# Patient Record
Sex: Male | Born: 1960 | Race: White | Hispanic: No | Marital: Married | Smoking: Never smoker
Health system: Southern US, Community
[De-identification: ages and names within clinical notes are randomized; demographics above are authoritative.]

## PROBLEM LIST (undated history)

## (undated) DIAGNOSIS — I10 Essential (primary) hypertension: Secondary | ICD-10-CM

## (undated) DIAGNOSIS — E039 Hypothyroidism, unspecified: Secondary | ICD-10-CM

## (undated) DIAGNOSIS — E785 Hyperlipidemia, unspecified: Secondary | ICD-10-CM

## (undated) DIAGNOSIS — M109 Gout, unspecified: Secondary | ICD-10-CM

## (undated) HISTORY — PX: VASECTOMY: SHX75

## (undated) HISTORY — DX: Hyperlipidemia, unspecified: E78.5

## (undated) HISTORY — PX: TENDON REPAIR: SHX5111

## (undated) HISTORY — DX: Gout, unspecified: M10.9

## (undated) HISTORY — DX: Essential (primary) hypertension: I10

## (undated) HISTORY — DX: Hypothyroidism, unspecified: E03.9

---

## 2011-06-10 ENCOUNTER — Emergency Department (HOSPITAL_COMMUNITY)
Admission: EM | Admit: 2011-06-10 | Discharge: 2011-06-10 | Disposition: A | Discharge status: Home / Self Care | Payer: Worker's Compensation | Attending: Emergency Medicine | Admitting: Emergency Medicine

## 2011-06-10 ENCOUNTER — Emergency Department (HOSPITAL_COMMUNITY): Payer: Worker's Compensation

## 2011-06-10 DIAGNOSIS — Y990 Civilian activity done for income or pay: Secondary | ICD-10-CM | POA: Insufficient documentation

## 2011-06-10 DIAGNOSIS — S61209A Unspecified open wound of unspecified finger without damage to nail, initial encounter: Secondary | ICD-10-CM | POA: Insufficient documentation

## 2011-06-10 DIAGNOSIS — E039 Hypothyroidism, unspecified: Secondary | ICD-10-CM | POA: Insufficient documentation

## 2011-06-10 DIAGNOSIS — W268XXA Contact with other sharp object(s), not elsewhere classified, initial encounter: Secondary | ICD-10-CM | POA: Insufficient documentation

## 2011-06-10 DIAGNOSIS — Z79899 Other long term (current) drug therapy: Secondary | ICD-10-CM | POA: Insufficient documentation

## 2011-06-10 LAB — CBC
HCT: 40.3 % (ref 39.0–52.0)
Hemoglobin: 14.6 g/dL (ref 13.0–17.0)
RBC: 4.42 MIL/uL (ref 4.22–5.81)
RDW: 11.6 % (ref 11.5–15.5)
WBC: 8.3 10*3/uL (ref 4.0–10.5)

## 2011-06-10 LAB — POCT I-STAT, CHEM 8
Chloride: 103 mEq/L (ref 96–112)
HCT: 44 % (ref 39.0–52.0)
Hemoglobin: 15 g/dL (ref 13.0–17.0)
Potassium: 4.2 mEq/L (ref 3.5–5.1)
Sodium: 138 mEq/L (ref 135–145)

## 2011-06-10 LAB — DIFFERENTIAL
Basophils Absolute: 0.1 10*3/uL (ref 0.0–0.1)
Lymphocytes Relative: 12 % (ref 12–46)
Neutro Abs: 6.3 10*3/uL (ref 1.7–7.7)

## 2011-06-10 LAB — PROTIME-INR: INR: 0.92 (ref 0.00–1.49)

## 2011-07-16 NOTE — Consult Note (Signed)
  NAMEJAMEL, HOLZMANN NO.:  [id number]  MEDICAL RECORD NO.:  [id number]  LOCATION:  [facility]                         FACILITY:  [facility]  PHYSICIAN:  Artist Pais. Krishav Mamone, M.D.DATE OF BIRTH:  Dec 07, 1961  DATE OF CONSULTATION:  06/10/2011 DATE OF DISCHARGE:                                CONSULTATION   REQUESTING PHYSICIAN:  Doug Sou, MD  REASON FOR CONSULTATION:  This is a 50 year old right-hand-dominant who was working today, changing a tire and lacerated the volar aspect of his right long finger with loss of active flexion from the DIP joint distal. Neurosensory exam was normal.  He is 49.  He has no known drug allergies.  No current medications except Synthroid and some med that he takes for hypercholesteremia.  No recent hospitalizations or surgery.  FAMILY MEDICAL HISTORY:  Noncontributory.  SOCIAL HISTORY:  Occasional alcohol use.  He does not smoke.  PHYSICAL EXAMINATION:  This is a well-nourished male, pleasant, alert and oriented x3.  Examination of his upper extremity, on the right, he has a transverse laceration of the PIP flexion crease with loss of active flexion distal.  Neurosensory exam is normal.  X-rays are negative and blood work and EKG are pending.  IMPRESSION:  This is a 50 year old male with a deep laceration on the volar aspect of his right long finger with probable tendon damage.  He will be taken to the operating room for an I and D, exploration repair as necessary of right long finger.     Artist Pais Mina Marble, M.D.     MAW/MEDQ  D:  06/10/2011  T:  06/11/2011  Job:  [id number]  Electronically Signed by Dairl Ponder M.D. on 07/16/2011 08:34:53 PM

## 2011-07-16 NOTE — Op Note (Signed)
  NAMEAC, COLAN NO.:  [id number]  MEDICAL RECORD NO.:  [id number]  LOCATION:  [facility]                         FACILITY:  [facility]  PHYSICIAN:  Artist Pais. Hershal Eriksson, M.D.DATE OF BIRTH:  [birth year]  DATE OF PROCEDURE:  06/10/2011 DATE OF DISCHARGE:                              OPERATIVE REPORT   PREOPERATIVE DIAGNOSIS:  Deep laceration, volar aspect, right long finger.  POSTOPERATIVE DIAGNOSIS:  Deep laceration, volar aspect, right long finger.  PROCEDURE:  Incision and drainage above with primary repair zone II flexor digitorum profundus and flexor digitorum superficialis tendons, right long finger.  SURGEON:  Artist Pais. Mina Marble, MD  ASSISTANT:  None.  ANESTHESIA:  General.  TOURNIQUET TIME:  60 minutes.  No complication.  No drains.  The patient was taken to the operating suite after induction of general anesthesia.  Right upper extremity was prepped and draped in sterile fashion.  An Esmarch was used to exsanguinate the limb.  Tourniquet was inflated to 250 mm.  At this point in time, a transverse laceration of the PIP flexion crease extended proximally and distally in mid lateral fashion with the radial limb proximal and the ulnar limb distal.  Once this was done, flaps were raised.  Neurovascularly, bone was identified and retracted.  There was a complete laceration profundus tendon zone II and partial lacerations of the superficialis slip at their insertion point just under the A4 pulley area.  The profundus tendon was retracted distal to the A4 pulley and proximal to the A2 pulley.  The superficialis slips were sutured with horizontal mattress sutures x1 at each insertion point using 4-0 Mersilene.  Next a 3-0 double-armed Ethibond was used to suture the retrieved proximal portion and also the distal portion.  The distal portion was then passed under the A4 pulley. The proximal portion of the profundus tendon was passed under the A2 pulley  and onto the wound.  They were sutured under no undue tension using a Tajima suture, followed by 6-0 Prolene epitenon suture just proximal to the edge of the A4 pulley.  The wound was then thoroughly irrigated.  Hemostasis was achieved with bipolar cautery and loosely closed with 4-0 Vicryl Rapide suture.  Xeroform, 4x4s and dorsal extension block splint was applied.  The patient tolerated the well and went to recovery room in stable fashion.     Artist Pais Mina Marble, M.D.     MAW/MEDQ  D:  06/10/2011  T:  06/11/2011  Job:  [id number]  Electronically Signed by Dairl Ponder M.D. on 07/16/2011 08:34:49 PM

## 2012-06-19 DIAGNOSIS — E039 Hypothyroidism, unspecified: Secondary | ICD-10-CM | POA: Insufficient documentation

## 2013-04-15 ENCOUNTER — Ambulatory Visit (HOSPITAL_BASED_OUTPATIENT_CLINIC_OR_DEPARTMENT_OTHER)
Admission: RE | Admit: 2013-04-15 | Discharge: 2013-04-15 | Disposition: A | Discharge status: Home / Self Care | Payer: 59 | Source: Ambulatory Visit | Attending: Family Medicine | Admitting: Family Medicine

## 2013-04-15 ENCOUNTER — Encounter: Payer: Self-pay | Admitting: Family Medicine

## 2013-04-15 ENCOUNTER — Ambulatory Visit (INDEPENDENT_AMBULATORY_CARE_PROVIDER_SITE_OTHER): Payer: 59 | Admitting: Family Medicine

## 2013-04-15 VITALS — BP 138/88 | HR 106 | Ht 70.0 in | Wt 188.0 lb

## 2013-04-15 DIAGNOSIS — M25569 Pain in unspecified knee: Secondary | ICD-10-CM

## 2013-04-15 DIAGNOSIS — M25562 Pain in left knee: Secondary | ICD-10-CM

## 2013-04-15 MED ORDER — IBUPROFEN 800 MG PO TABS: 800.0000 mg | ORAL_TABLET | Freq: Three times a day (TID) | ORAL | Status: DC | PRN

## 2013-04-15 NOTE — Patient Instructions (Addendum)
Your x-rays were negative for a fracture. Your exam and history are consistent with a gout flare. Less likely consideration would be a meniscal tear but your specific testing for this is negative. Cortisone shot was given today - may take a few days for this to kick in. Ibuprofen 800mg  three times a day with food for pain and inflammation. Ice knee 15 minutes at a time 3-4 times a day. ACE wrap for compression. Elevate as much as possible. Use crutches as needed. Consider colchicine, prednisone if not improving as expected over the next week. Follow up with me in 1-2 weeks or as needed.

## 2013-04-16 ENCOUNTER — Encounter: Payer: Self-pay | Admitting: Family Medicine

## 2013-04-16 DIAGNOSIS — M25562 Pain in left knee: Secondary | ICD-10-CM | POA: Insufficient documentation

## 2013-04-16 NOTE — Assessment & Plan Note (Signed)
started with injury sustained at work. Radiographs negative for tibial plateau or other fracture.  Meniscal and ligamentous testing negative.  Consistent with either an acute gout flare or subtle meniscal tear.  Start with conservative care.  Attempted aspiration but only 1 mL straw colored (blood-tinged) joint fluid obtained prior to injection.  Ibuprofen, icing, elevation, compression.  F/u in 1-2 weeks for reevaluation (or as needed if completely improved.  After informed written consent patient was lying supine on exam table.  Right knee was prepped with alcohol swab.  Utilizing superolateral approach, 3 mL of marcaine was used for local anesthesia.  Then using an 18g needle on 60cc syringe, 1 mL of clear straw-colored fluid was aspirated from right knee (effusion confirmed with ultrasound).  Knee was then injected with 3:1 marcaine:depomedrol.  Patient tolerated procedure well without immediate complications

## 2013-04-16 NOTE — Progress Notes (Signed)
  Subjective:    Patient ID: George Hawkins, male    DOB: 1961/07/28, 52 y.o.   MRN: [id number]  PCP: Dr. Dossie Arbour  HPI 52 yo M here for left knee injury/pain.  Patient states on 4/19 he was loading a truck with brush when he was tangled in it, fell directly onto his left knee. Thinks he twisted knee as well - became progressively swollen, more painful. Taking ibuprofen. Has been using crutches and elevating as well. No bruising. Has prior history of gout. Knee more warm than usual. No catching, locking.  Past Medical History  Diagnosis Date  . Hypertension   . Hyperlipidemia   . Hypothyroidism   . Gout     No current outpatient prescriptions on file prior to visit.   No current facility-administered medications on file prior to visit.    History reviewed. No pertinent past surgical history.  No Known Allergies  History   Social History  . Marital Status: Married    Spouse Name: N/A    Number of Children: N/A  . Years of Education: N/A   Occupational History  . Not on file.   Social History Main Topics  . Smoking status: Never Smoker   . Smokeless tobacco: Not on file  . Alcohol Use: Not on file  . Drug Use: Not on file  . Sexually Active: Not on file   Other Topics Concern  . Not on file   Social History Narrative  . No narrative on file    Family History  Problem Relation Age of Onset  . Diabetes Brother   . Heart attack Neg Hx   . Hyperlipidemia Neg Hx   . Hypertension Neg Hx   . Sudden death Neg Hx     BP 138/88  Pulse 106  Ht 5\' 10"  (1.778 m)  Wt 188 lb (85.276 kg)  BMI 26.98 kg/m2  Review of Systems See HPI above.    Objective:   Physical Exam Gen: NAD  L knee: Mild effusion and warmth. No erythema, ecchymoses, other deformity. Mild TTP diffusely. ROM 0 - 90 degrees, slow. Negative ant/post drawers. Negative valgus/varus testing. Negative lachmanns. Negative mcmurrays, apleys, patellar apprehension, clarkes. NV intact  distally.    Assessment & Plan:  1. Left knee pain, swelling - started with injury sustained at work. Radiographs negative for tibial plateau or other fracture.  Meniscal and ligamentous testing negative.  Consistent with either an acute gout flare or subtle meniscal tear.  Start with conservative care.  Attempted aspiration but only 1 mL straw colored (blood-tinged) joint fluid obtained prior to injection.  Ibuprofen, icing, elevation, compression.  F/u in 1-2 weeks for reevaluation (or as needed if completely improved.  After informed written consent patient was lying supine on exam table.  Right knee was prepped with alcohol swab.  Utilizing superolateral approach, 3 mL of marcaine was used for local anesthesia.  Then using an 18g needle on 60cc syringe, 1 mL of clear straw-colored fluid was aspirated from right knee (effusion confirmed with ultrasound).  Knee was then injected with 3:1 marcaine:depomedrol.  Patient tolerated procedure well without immediate complications

## 2013-12-20 ENCOUNTER — Encounter: Payer: Self-pay | Admitting: Podiatry

## 2013-12-20 ENCOUNTER — Ambulatory Visit (INDEPENDENT_AMBULATORY_CARE_PROVIDER_SITE_OTHER): Payer: 59

## 2013-12-20 ENCOUNTER — Ambulatory Visit (INDEPENDENT_AMBULATORY_CARE_PROVIDER_SITE_OTHER): Payer: 59 | Admitting: Podiatry

## 2013-12-20 VITALS — BP 155/88 | HR 80 | Resp 16

## 2013-12-20 DIAGNOSIS — R609 Edema, unspecified: Secondary | ICD-10-CM

## 2013-12-20 DIAGNOSIS — M109 Gout, unspecified: Secondary | ICD-10-CM

## 2013-12-20 DIAGNOSIS — M7750 Other enthesopathy of unspecified foot: Secondary | ICD-10-CM

## 2013-12-20 MED ORDER — COLCHICINE 0.6 MG PO TABS: ORAL_TABLET | ORAL | Status: DC

## 2013-12-20 MED ORDER — METHYLPREDNISOLONE (PAK) 4 MG PO TABS: ORAL_TABLET | ORAL | Status: DC

## 2013-12-20 MED ORDER — TRIAMCINOLONE ACETONIDE 10 MG/ML IJ SUSP
10.0000 mg | Freq: Once | INTRAMUSCULAR | Status: AC
  Administered 2013-12-20: 10 mg

## 2013-12-20 NOTE — Patient Instructions (Signed)

## 2013-12-20 NOTE — Progress Notes (Signed)
Subjective:     Patient ID: George Hawkins, male   DOB: 1961-04-04, 52 y.o.   MRN: [id number]  HPI patient presents stating my foot started to hurt me 3 days ago and I feel like the gout is back again. Stating he rich food over the Christmas holiday which probably lead towards this and he has been taking his allopurinol. Points to the heel top and outside of the foot with +2 swelling noted  Review of Systems     Objective:   Physical Exam Neurovascular status intact with negative Homans sign noted exquisite discomfort right plantar heel and moderate discomfort dorsum of the right foot and towards the lateral side of the foot.    Assessment:     Acute gout attack affecting the heel with edema which is most likely related to this inflammatory complex    Plan:     X-ray and gout condition reviewed. We are going to start him on a steroid pack and I also prescribed colchrys to use if attacks occur and today I injected the right plantar heel 3 mg Kenalog 5 mg Xylocaine Marcaine mixture and applied Unna boot to reduce swelling. If symptoms do not get better in the next several days I want to see back

## 2014-01-14 ENCOUNTER — Other Ambulatory Visit: Payer: Self-pay | Admitting: Podiatry

## 2014-02-07 ENCOUNTER — Ambulatory Visit: Payer: Self-pay | Admitting: Gastroenterology

## 2014-02-07 LAB — HM COLONOSCOPY

## 2014-02-09 LAB — PATHOLOGY REPORT

## 2014-05-02 ENCOUNTER — Other Ambulatory Visit: Payer: Self-pay | Admitting: Podiatry

## 2014-05-03 ENCOUNTER — Encounter: Payer: Self-pay | Admitting: Podiatry

## 2014-05-03 ENCOUNTER — Ambulatory Visit (INDEPENDENT_AMBULATORY_CARE_PROVIDER_SITE_OTHER): Payer: 59 | Admitting: Podiatry

## 2014-05-03 DIAGNOSIS — M7750 Other enthesopathy of unspecified foot: Secondary | ICD-10-CM

## 2014-05-03 MED ORDER — ALLOPURINOL 300 MG PO TABS: 300.0000 mg | ORAL_TABLET | Freq: Every day | ORAL | Status: DC

## 2014-05-04 NOTE — Progress Notes (Signed)
Subjective:     Patient ID: George Hawkins, male   DOB: Sep 06, 1961, 53 y.o.   MRN: [id number]  HPI presents for refill stating his gout has been doing well and his tendinitis   Review of Systems     Objective:   Physical Exam No change in neurovascular or health status    Assessment:     Doing well    Plan:     Prescription written again today for allopurinol 300 mg daily

## 2014-11-10 IMAGING — CR DG KNEE COMPLETE 4+V*L*
4 series · 4 of 4 positions shown · non-contrast
Comparison: None.

CLINICAL DATA: Pain post trauma

LEFT KNEE - COMPLETE 4+ VIEW

[t knee ap left]
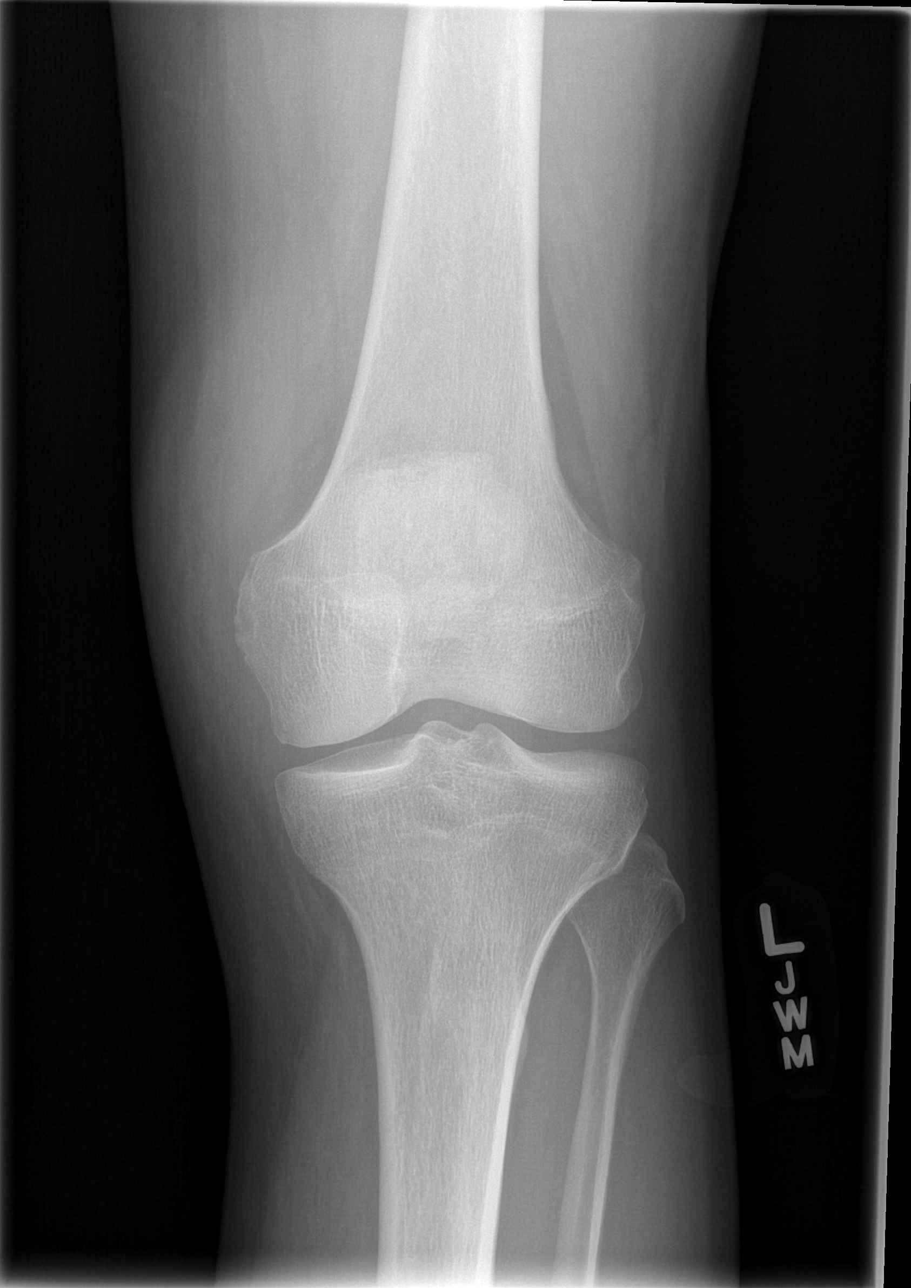

[t knee oblique left (1 of 2)]
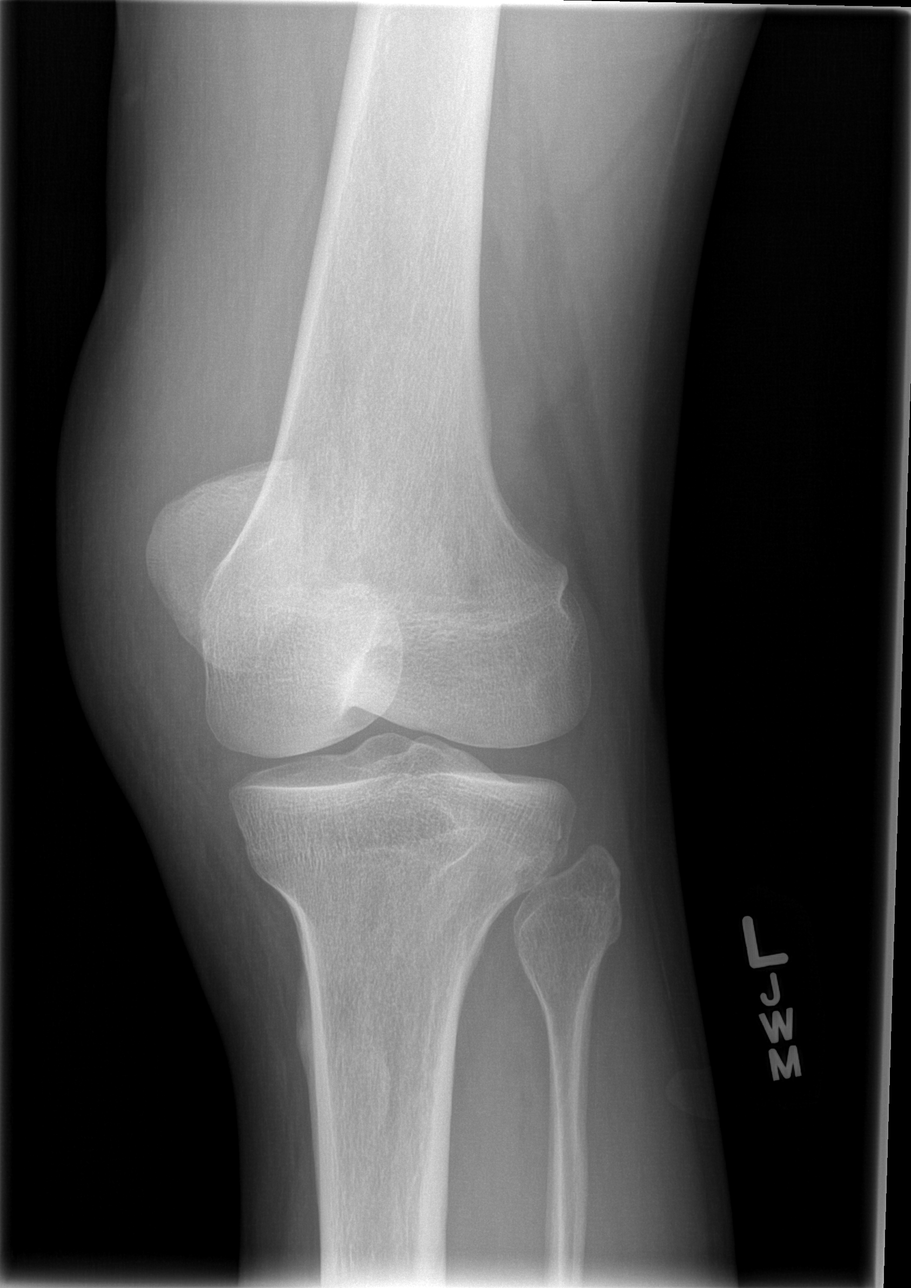

[t knee oblique left (2 of 2)]
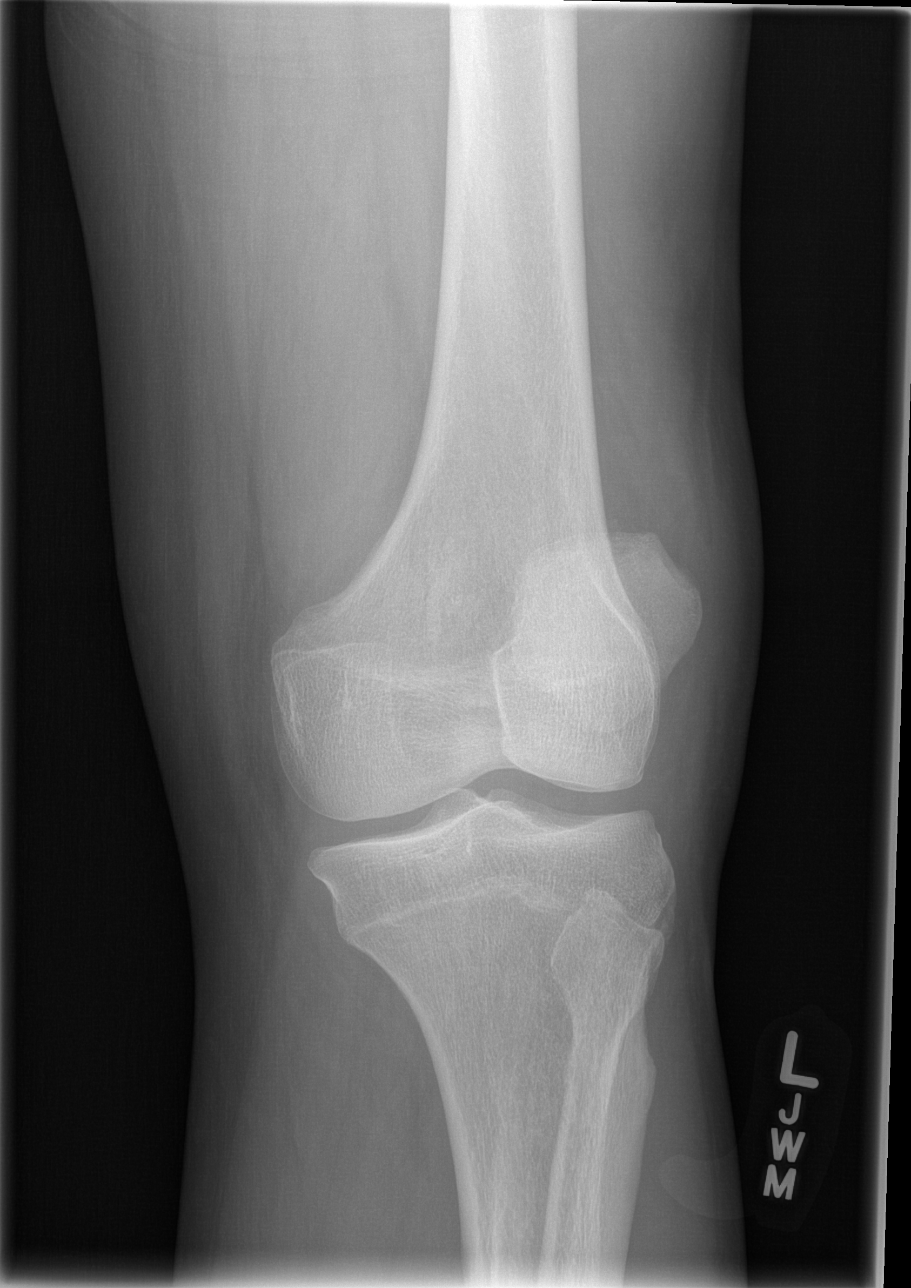

[t knee lat left]
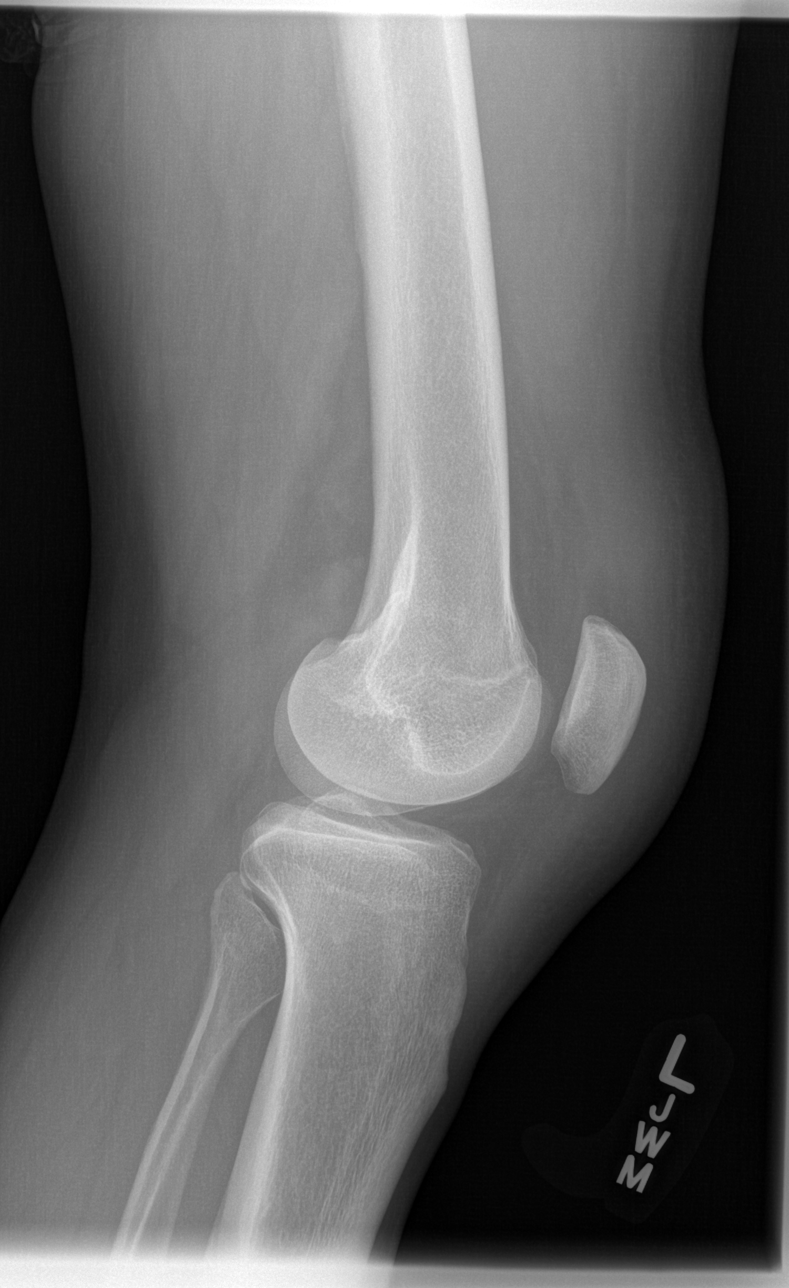

[4 of 4 positions shown; findings below may reference images not displayed]

FINDINGS: Frontal, lateral, bilateral oblique views were obtained.
There is prepatellar soft tissue swelling.  There is no apparent
fracture, dislocation, or effusion.  No erosive change.  Joint
spaces appear intact.
IMPRESSION: No fracture or effusion.  There is prepatellar soft tissue
swelling.  Joint spaces appear intact.

## 2014-12-20 ENCOUNTER — Telehealth: Payer: Self-pay | Admitting: *Deleted

## 2014-12-20 NOTE — Telephone Encounter (Signed)
Pt request refill of his Allopurinol.

## 2014-12-20 NOTE — Telephone Encounter (Signed)
Spoke to [facility] , per Dr Paulla Dolly needs to follow up with primary doctor concerning allopurinol medication

## 2015-10-30 ENCOUNTER — Encounter: Payer: Self-pay | Admitting: Unknown Physician Specialty

## 2015-10-30 ENCOUNTER — Ambulatory Visit (INDEPENDENT_AMBULATORY_CARE_PROVIDER_SITE_OTHER): Payer: 59 | Admitting: Unknown Physician Specialty

## 2015-10-30 VITALS — BP 129/81 | HR 85 | Temp 97.6°F | Ht 70.8 in | Wt 205.6 lb

## 2015-10-30 DIAGNOSIS — E785 Hyperlipidemia, unspecified: Secondary | ICD-10-CM | POA: Diagnosis not present

## 2015-10-30 DIAGNOSIS — E039 Hypothyroidism, unspecified: Secondary | ICD-10-CM | POA: Diagnosis not present

## 2015-10-30 DIAGNOSIS — M1A9XX0 Chronic gout, unspecified, without tophus (tophi): Secondary | ICD-10-CM | POA: Diagnosis not present

## 2015-10-30 DIAGNOSIS — Z0000 Encounter for general adult medical examination without abnormal findings: Secondary | ICD-10-CM

## 2015-10-30 DIAGNOSIS — I10 Essential (primary) hypertension: Secondary | ICD-10-CM | POA: Diagnosis not present

## 2015-10-30 LAB — MICROALBUMIN, URINE WAIVED
Creatinine, Urine Waived: 50 mg/dL (ref 10–300)
Microalb, Ur Waived: 10 mg/L (ref 0–19)
Microalb/Creat Ratio: <30 mg/g (ref <30)

## 2015-10-30 MED ORDER — LEVOTHYROXINE SODIUM 150 MCG PO TABS
150.0000 ug | ORAL_TABLET | Freq: Every day | ORAL | Status: DC
Start: 2015-10-30 — End: 2016-12-09

## 2015-10-30 MED ORDER — ATORVASTATIN CALCIUM 10 MG PO TABS: 10.0000 mg | ORAL_TABLET | Freq: Every day | ORAL | Status: DC

## 2015-10-30 MED ORDER — ATENOLOL 100 MG PO TABS: 100.0000 mg | ORAL_TABLET | Freq: Every day | ORAL | Status: DC

## 2015-10-30 NOTE — Progress Notes (Signed)
+++++++    +  BP 129/81 mmHg  Pulse 85  Temp(Src) 97.6 F (36.4 C)  Ht 5' 10.8" (1.798 m)  Wt 205 lb 9.6 oz (93.26 kg)  BMI 28.85 kg/m2  SpO2 96%   Subjective:    Patient ID: George Hawkins, male    DOB: [birth year], 54 y.o.   MRN: [id number]  HPI: George Hawkins is a 54 y.o. male  Chief Complaint  Patient presents with  . Annual Exam   Hypertension Using medications without difficulty Average home BPs   No problems or lightheadedness No chest pain with exertion or shortness of breath No Edema   Hyperlipidemia Using medications without problems No Muscle aches Diet compliance:good Exercise: Started "some" exercise    Relevant past medical, surgical, family and social history reviewed and updated as indicated. Interim medical history since our last visit reviewed. Allergies and medications reviewed and updated.  Review of Systems  Constitutional: Negative.   HENT: Negative.   Eyes: Negative.   Respiratory: Negative.   Cardiovascular: Negative.   Gastrointestinal: Negative.   Endocrine: Negative.   Genitourinary: Negative.   Skin: Negative.   Allergic/Immunologic: Negative.   Neurological: Negative.   Hematological: Negative.   Psychiatric/Behavioral: Negative.     Per HPI unless specifically indicated above     Objective:    BP 129/81 mmHg  Pulse 85  Temp(Src) 97.6 F (36.4 C)  Ht 5' 10.8" (1.798 m)  Wt 205 lb 9.6 oz (93.26 kg)  BMI 28.85 kg/m2  SpO2 96%  Wt Readings from Last 3 Encounters:  10/30/15 205 lb 9.6 oz (93.26 kg)  04/18/15 208 lb (94.348 kg)  04/15/13 188 lb (85.276 kg)    Physical Exam  Constitutional: He is oriented to person, place, and time. He appears well-developed and well-nourished.  HENT:  Head: Normocephalic.  Eyes: Pupils are equal, round, and reactive to light.  Cardiovascular: Normal rate, regular rhythm and normal heart sounds.   Pulmonary/Chest: Effort normal.  Abdominal: Soft. Bowel sounds are normal.   Musculoskeletal: Normal range of motion.  Neurological: He is alert and oriented to person, place, and time. He has normal reflexes.  Skin: Skin is warm and dry.  Psychiatric: He has a normal mood and affect. His behavior is normal. Judgment and thought content normal.    Results for orders placed or performed in visit on 10/27/15  HM COLONOSCOPY  Result Value Ref Range   HM Colonoscopy PP       Assessment & Plan:   Problem List Items Addressed This Visit      Unprioritized   Adult hypothyroidism   Relevant Medications   atenolol (TENORMIN) 100 MG tablet   levothyroxine (SYNTHROID, LEVOTHROID) 150 MCG tablet   Hypertension - Primary   Relevant Medications   atenolol (TENORMIN) 100 MG tablet   atorvastatin (LIPITOR) 10 MG tablet   Other Relevant Orders   Comprehensive metabolic panel   Microalbumin, Urine Waived   Uric acid   Hyperlipidemia   Relevant Medications   atenolol (TENORMIN) 100 MG tablet   atorvastatin (LIPITOR) 10 MG tablet   Other Relevant Orders   Comprehensive metabolic panel   Lipid Panel w/o Chol/HDL Ratio   Chronic gout   Relevant Orders   Uric acid    Other Visit Diagnoses    Routine general medical examination at a health care facility        Relevant Orders    CBC with Differential/Platelet    Hepatitis C antibody  HIV antibody    TSH        Follow up plan: Return in about 6 months (around 04/28/2016).

## 2015-10-31 ENCOUNTER — Encounter: Payer: Self-pay | Admitting: Unknown Physician Specialty

## 2015-10-31 LAB — URIC ACID: Uric Acid: 9.3 mg/dL — ABNORMAL HIGH (ref 3.7–8.6)

## 2015-10-31 LAB — LIPID PANEL W/O CHOL/HDL RATIO
Cholesterol, Total: 172 mg/dL (ref 100–199)
HDL: 41 mg/dL (ref >39)
LDL Calculated: 58 mg/dL (ref 0–99)
TRIGLYCERIDES: 366 mg/dL — AB (ref 0–149)
VLDL CHOLESTEROL CAL: 73 mg/dL — AB (ref 5–40)

## 2015-10-31 LAB — CBC WITH DIFFERENTIAL/PLATELET
BASOS: 1 %
Basophils Absolute: 0.1 10*3/uL (ref 0.0–0.2)
EOS (ABSOLUTE): 0.3 10*3/uL (ref 0.0–0.4)
EOS: 5 %
HEMATOCRIT: 40.6 % (ref 37.5–51.0)
Hemoglobin: 14.2 g/dL (ref 12.6–17.7)
IMMATURE GRANS (ABS): 0 10*3/uL (ref 0.0–0.1)
IMMATURE GRANULOCYTES: 1 %
LYMPHS: 20 %
Lymphocytes Absolute: 1 10*3/uL (ref 0.7–3.1)
MCH: 33.4 pg — ABNORMAL HIGH (ref 26.6–33.0)
MCHC: 35 g/dL (ref 31.5–35.7)
MCV: 96 fL (ref 79–97)
MONOCYTES: 11 %
Monocytes Absolute: 0.5 10*3/uL (ref 0.1–0.9)
NEUTROS PCT: 62 %
Neutrophils Absolute: 3.1 10*3/uL (ref 1.4–7.0)
Platelets: 178 10*3/uL (ref 150–379)
RBC: 4.25 x10E6/uL (ref 4.14–5.80)
RDW: 12.9 % (ref 12.3–15.4)
WBC: 4.9 10*3/uL (ref 3.4–10.8)

## 2015-10-31 LAB — COMPREHENSIVE METABOLIC PANEL
A/G RATIO: 1.9 (ref 1.1–2.5)
ALT: 34 IU/L (ref 0–44)
AST: 22 IU/L (ref 0–40)
Albumin: 4.5 g/dL (ref 3.5–5.5)
Alkaline Phosphatase: 80 IU/L (ref 39–117)
BUN/Creatinine Ratio: 19 (ref 9–20)
BUN: 17 mg/dL (ref 6–24)
Bilirubin Total: 0.4 mg/dL (ref 0.0–1.2)
CALCIUM: 9.5 mg/dL (ref 8.7–10.2)
CO2: 22 mmol/L (ref 18–29)
CREATININE: 0.89 mg/dL (ref 0.76–1.27)
Chloride: 100 mmol/L (ref 97–106)
GFR, EST AFRICAN AMERICAN: 114 mL/min/{1.73_m2} (ref >59)
GFR, EST NON AFRICAN AMERICAN: 98 mL/min/{1.73_m2} (ref >59)
GLOBULIN, TOTAL: 2.4 g/dL (ref 1.5–4.5)
Glucose: 104 mg/dL — ABNORMAL HIGH (ref 65–99)
POTASSIUM: 5.2 mmol/L (ref 3.5–5.2)
Sodium: 138 mmol/L (ref 136–144)
TOTAL PROTEIN: 6.9 g/dL (ref 6.0–8.5)

## 2015-10-31 LAB — HIV ANTIBODY (ROUTINE TESTING W REFLEX): HIV SCREEN 4TH GENERATION: NONREACTIVE

## 2015-10-31 LAB — TSH: TSH: 0.611 u[IU]/mL (ref 0.450–4.500)

## 2015-10-31 LAB — HEPATITIS C ANTIBODY: Hep C Virus Ab: <0.1 s/co ratio (ref 0.0–0.9)

## 2015-12-28 MED FILL — ATORVASTATIN 10 MG TABLET: 10 | 90 days supply | Qty: 90 | Fill #0

## 2016-02-13 MED FILL — ATENOLOL 100 MG TABLET: 100 | 90 days supply | Qty: 90 | Fill #1

## 2016-02-26 MED FILL — LEVOTHYROXINE 150 MCG TAB: 150 | 90 days supply | Qty: 90 | Fill #1

## 2016-03-29 MED FILL — ATORVASTATIN 10 MG TABLET: 10 | 90 days supply | Qty: 90 | Fill #1

## 2016-04-29 ENCOUNTER — Encounter: Payer: Self-pay | Admitting: Unknown Physician Specialty

## 2016-04-29 ENCOUNTER — Ambulatory Visit (INDEPENDENT_AMBULATORY_CARE_PROVIDER_SITE_OTHER): Payer: 59 | Admitting: Unknown Physician Specialty

## 2016-04-29 VITALS — BP 138/84 | HR 64 | Temp 97.6°F | Ht 70.6 in | Wt 200.2 lb

## 2016-04-29 DIAGNOSIS — I10 Essential (primary) hypertension: Secondary | ICD-10-CM | POA: Diagnosis not present

## 2016-04-29 DIAGNOSIS — E785 Hyperlipidemia, unspecified: Secondary | ICD-10-CM | POA: Diagnosis not present

## 2016-04-29 DIAGNOSIS — R7301 Impaired fasting glucose: Secondary | ICD-10-CM

## 2016-04-29 LAB — MICROALBUMIN, URINE WAIVED
CREATININE, URINE WAIVED: 200 mg/dL (ref 10–300)
Microalb, Ur Waived: 10 mg/L (ref 0–19)

## 2016-04-29 LAB — BAYER DCA HB A1C WAIVED: HB A1C (BAYER DCA - WAIVED): 5.4 % (ref <7.0)

## 2016-04-29 MED ORDER — ATENOLOL 100 MG PO TABS: 100.0000 mg | ORAL_TABLET | Freq: Every day | ORAL | Status: DC

## 2016-04-29 MED ORDER — ATORVASTATIN CALCIUM 10 MG PO TABS: 10.0000 mg | ORAL_TABLET | Freq: Every day | ORAL | Status: DC

## 2016-04-29 NOTE — Progress Notes (Signed)
   BP 138/84 mmHg  Pulse 64  Temp(Src) 97.6 F (36.4 C)  Ht 5' 10.6" (1.793 m)  Wt 200 lb 3.2 oz (90.81 kg)  BMI 28.25 kg/m2  SpO2 98%   Subjective:    Patient ID: George Hawkins, male    DOB: 1961/01/17, 55 y.o.   MRN: [id number]  HPI: [name] is a 55 y.o. male  Chief Complaint  Patient presents with  . Hyperlipidemia  . Hypertension  . Hypothyroidism   Hypertension Using medications without difficulty Average home BPs Not checking  No problems or lightheadedness No chest pain with exertion or shortness of breath No Edema   Hyperlipidemia Using medications without problems: No Muscle aches  Diet compliance: Exercise:  Hypothyroid   Relevant past medical, surgical, family and social history reviewed and updated as indicated. Interim medical history since our last visit reviewed. Allergies and medications reviewed and updated.  Review of Systems  Per HPI unless specifically indicated above     Objective:    BP 138/84 mmHg  Pulse 64  Temp(Src) 97.6 F (36.4 C)  Ht 5' 10.6" (1.793 m)  Wt 200 lb 3.2 oz (90.81 kg)  BMI 28.25 kg/m2  SpO2 98%  Wt Readings from Last 3 Encounters:  04/29/16 200 lb 3.2 oz (90.81 kg)  10/30/15 205 lb 9.6 oz (93.26 kg)  04/18/15 208 lb (94.348 kg)    Physical Exam  Constitutional: He is oriented to person, place, and time. He appears well-developed and well-nourished. No distress.  HENT:  Head: Normocephalic and atraumatic.  Eyes: Conjunctivae and lids are normal. Right eye exhibits no discharge. Left eye exhibits no discharge. No scleral icterus.  Neck: Normal range of motion. Neck supple. No JVD present. Carotid bruit is not present.  Cardiovascular: Normal rate, regular rhythm and normal heart sounds.   Pulmonary/Chest: Effort normal and breath sounds normal. No respiratory distress.  Abdominal: Normal appearance. There is no splenomegaly or hepatomegaly.  Musculoskeletal: Normal range of motion.   Neurological: He is alert and oriented to person, place, and time.  Skin: Skin is warm, dry and intact. No rash noted. No pallor.  Psychiatric: He has a normal mood and affect. His behavior is normal. Judgment and thought content normal.       Assessment & Plan:   Problem List Items Addressed This Visit      Unprioritized   Hyperlipidemia   Relevant Medications   atenolol (TENORMIN) 100 MG tablet   atorvastatin (LIPITOR) 10 MG tablet   Other Relevant Orders   Lipid Panel w/o Chol/HDL Ratio   Uric acid   Microalbumin, Urine Waived   Hypertension - Primary   Relevant Medications   atenolol (TENORMIN) 100 MG tablet   atorvastatin (LIPITOR) 10 MG tablet   Other Relevant Orders   Comprehensive metabolic panel    Other Visit Diagnoses    IFG (impaired fasting glucose)        Relevant Orders    Bayer DCA Hb A1c Waived       Diagnosis stable.  Continue present medications.  Refilled meds for 6 months  Follow up plan: Return in about 6 months (around 10/30/2016) for physical.

## 2016-04-30 ENCOUNTER — Telehealth: Payer: Self-pay | Admitting: Unknown Physician Specialty

## 2016-04-30 LAB — COMPREHENSIVE METABOLIC PANEL
A/G RATIO: 2.2 (ref 1.2–2.2)
ALK PHOS: 69 IU/L (ref 39–117)
ALT: 33 IU/L (ref 0–44)
AST: 22 IU/L (ref 0–40)
Albumin: 4.7 g/dL (ref 3.5–5.5)
BUN/Creatinine Ratio: 14 (ref 9–20)
BUN: 16 mg/dL (ref 6–24)
Bilirubin Total: 0.6 mg/dL (ref 0.0–1.2)
CALCIUM: 9.4 mg/dL (ref 8.7–10.2)
CO2: 23 mmol/L (ref 18–29)
CREATININE: 1.12 mg/dL (ref 0.76–1.27)
Chloride: 100 mmol/L (ref 96–106)
GFR calc Af Amer: 87 mL/min/{1.73_m2} (ref >59)
GFR, EST NON AFRICAN AMERICAN: 75 mL/min/{1.73_m2} (ref >59)
Globulin, Total: 2.1 g/dL (ref 1.5–4.5)
Glucose: 107 mg/dL — ABNORMAL HIGH (ref 65–99)
POTASSIUM: 4.7 mmol/L (ref 3.5–5.2)
Sodium: 139 mmol/L (ref 134–144)
Total Protein: 6.8 g/dL (ref 6.0–8.5)

## 2016-04-30 LAB — URIC ACID: URIC ACID: 11 mg/dL — AB (ref 3.7–8.6)

## 2016-04-30 LAB — LIPID PANEL W/O CHOL/HDL RATIO
CHOLESTEROL TOTAL: 185 mg/dL (ref 100–199)
HDL: 36 mg/dL — AB (ref >39)
TRIGLYCERIDES: 451 mg/dL — AB (ref 0–149)

## 2016-04-30 NOTE — Telephone Encounter (Signed)
Called to discuss labs    Left message to call back

## 2016-05-01 ENCOUNTER — Telehealth: Payer: Self-pay

## 2016-05-01 ENCOUNTER — Encounter: Payer: Self-pay | Admitting: Unknown Physician Specialty

## 2016-05-01 NOTE — Telephone Encounter (Signed)
Unable to reach patient.  Left a message and sent a letter about his labs

## 2016-05-01 NOTE — Telephone Encounter (Signed)
Patient called and left me a voicemail stating that he was returning a call from [place]. He would like to be called back at [phone].

## 2016-05-16 MED FILL — ATENOLOL 100 MG TABLET: 100 | 90 days supply | Qty: 90 | Fill #0

## 2016-05-31 MED FILL — LEVOTHYROXINE 150 MCG TAB: 150 | 90 days supply | Qty: 90 | Fill #2

## 2016-06-28 MED FILL — ATORVASTATIN 10 MG TABLET: 10 | 90 days supply | Qty: 90 | Fill #0

## 2016-08-19 MED FILL — ATENOLOL 100 MG TABLET: 100 | 30 days supply | Qty: 30 | Fill #1

## 2016-09-04 MED FILL — LEVOTHYROXINE 150 MCG TAB: 150 | 90 days supply | Qty: 90 | Fill #3

## 2016-09-19 MED FILL — ATENOLOL 100 MG TABLET: 100 | 30 days supply | Qty: 30 | Fill #2

## 2016-10-02 MED FILL — ATORVASTATIN 10 MG TABLET: 10 | 90 days supply | Qty: 90 | Fill #1

## 2016-10-17 DIAGNOSIS — H524 Presbyopia: Secondary | ICD-10-CM | POA: Diagnosis not present

## 2016-10-21 MED FILL — ATENOLOL 100 MG TABLET: 100 | 30 days supply | Qty: 30 | Fill #3

## 2016-11-04 ENCOUNTER — Encounter: Payer: Self-pay | Admitting: Unknown Physician Specialty

## 2016-11-04 ENCOUNTER — Ambulatory Visit (INDEPENDENT_AMBULATORY_CARE_PROVIDER_SITE_OTHER): Payer: 59 | Admitting: Unknown Physician Specialty

## 2016-11-04 VITALS — BP 148/91 | HR 76 | Temp 98.5°F | Ht 70.8 in | Wt 204.0 lb

## 2016-11-04 DIAGNOSIS — I10 Essential (primary) hypertension: Secondary | ICD-10-CM

## 2016-11-04 DIAGNOSIS — E039 Hypothyroidism, unspecified: Secondary | ICD-10-CM | POA: Diagnosis not present

## 2016-11-04 DIAGNOSIS — Z0000 Encounter for general adult medical examination without abnormal findings: Secondary | ICD-10-CM | POA: Diagnosis not present

## 2016-11-04 DIAGNOSIS — E782 Mixed hyperlipidemia: Secondary | ICD-10-CM

## 2016-11-04 LAB — MICROALBUMIN, URINE WAIVED
CREATININE, URINE WAIVED: 200 mg/dL (ref 10–300)
Microalb, Ur Waived: 10 mg/L (ref 0–19)

## 2016-11-04 NOTE — Assessment & Plan Note (Signed)
A little high.  Work on diet and exercise and monitor it at home.  Recheck in 6 months

## 2016-11-04 NOTE — Patient Instructions (Addendum)
DASH Eating Plan  DASH stands for "Dietary Approaches to Stop Hypertension." The DASH eating plan is a healthy eating plan that has been shown to reduce high blood pressure (hypertension). Additional health benefits may include reducing the risk of type 2 diabetes mellitus, heart disease, and stroke. The DASH eating plan may also help with weight loss.  WHAT DO I NEED TO KNOW ABOUT THE DASH EATING PLAN?  For the DASH eating plan, you will follow these general guidelines:  · Choose foods with a percent daily value for sodium of less than 5% (as listed on the food label).  · Use salt-free seasonings or herbs instead of table salt or sea salt.  · Check with your health care provider or pharmacist before using salt substitutes.  · Eat lower-sodium products, often labeled as "lower sodium" or "no salt added."  · Eat fresh foods.  · Eat more vegetables, fruits, and low-fat dairy products.  · Choose whole grains. Look for the word "whole" as the first word in the ingredient list.  · Choose fish and skinless chicken or turkey more often than red meat. Limit fish, poultry, and meat to 6 oz (170 g) each day.  · Limit sweets, desserts, sugars, and sugary drinks.  · Choose heart-healthy fats.  · Limit cheese to 1 oz (28 g) per day.  · Eat more home-cooked food and less restaurant, buffet, and fast food.  · Limit fried foods.  · Cook foods using methods other than frying.  · Limit canned vegetables. If you do use them, rinse them well to decrease the sodium.  · When eating at a restaurant, ask that your food be prepared with less salt, or no salt if possible.  WHAT FOODS CAN I EAT?  Seek help from a dietitian for individual calorie needs.  Grains  Whole grain or whole wheat bread. Brown rice. Whole grain or whole wheat pasta. Quinoa, bulgur, and whole grain cereals. Low-sodium cereals. Corn or whole wheat flour tortillas. Whole grain cornbread. Whole grain crackers. Low-sodium crackers.  Vegetables  Fresh or frozen vegetables  (raw, steamed, roasted, or grilled). Low-sodium or reduced-sodium tomato and vegetable juices. Low-sodium or reduced-sodium tomato sauce and paste. Low-sodium or reduced-sodium canned vegetables.   Fruits  All fresh, canned (in natural juice), or frozen fruits.  Meat and Other Protein Products  Ground beef (85% or leaner), grass-fed beef, or beef trimmed of fat. Skinless chicken or turkey. Ground chicken or turkey. Pork trimmed of fat. All fish and seafood. Eggs. Dried beans, peas, or lentils. Unsalted nuts and seeds. Unsalted canned beans.  Dairy  Low-fat dairy products, such as skim or 1% milk, 2% or reduced-fat cheeses, low-fat ricotta or cottage cheese, or plain low-fat yogurt. Low-sodium or reduced-sodium cheeses.  Fats and Oils  Tub margarines without trans fats. Light or reduced-fat mayonnaise and salad dressings (reduced sodium). Avocado. Safflower, olive, or canola oils. Natural peanut or almond butter.  Other  Unsalted popcorn and pretzels.  The items listed above may not be a complete list of recommended foods or beverages. Contact your dietitian for more options.  WHAT FOODS ARE NOT RECOMMENDED?  Grains  White bread. White pasta. White rice. Refined cornbread. Bagels and croissants. Crackers that contain trans fat.  Vegetables  Creamed or fried vegetables. Vegetables in a cheese sauce. Regular canned vegetables. Regular canned tomato sauce and paste. Regular tomato and vegetable juices.  Fruits  Dried fruits. Canned fruit in light or heavy syrup. Fruit juice.  Meat and Other Protein   Products  Fatty cuts of meat. Ribs, chicken wings, bacon, sausage, bologna, salami, chitterlings, fatback, hot dogs, bratwurst, and packaged luncheon meats. Salted nuts and seeds. Canned beans with salt.  Dairy  Whole or 2% milk, cream, half-and-half, and cream cheese. Whole-fat or sweetened yogurt. Full-fat cheeses or blue cheese. Nondairy creamers and whipped toppings. Processed cheese, cheese spreads, or cheese  curds.  Condiments  Onion and garlic salt, seasoned salt, table salt, and sea salt. Canned and packaged gravies. Worcestershire sauce. Tartar sauce. Barbecue sauce. Teriyaki sauce. Soy sauce, including reduced sodium. Steak sauce. Fish sauce. Oyster sauce. Cocktail sauce. Horseradish. Ketchup and mustard. Meat flavorings and tenderizers. Bouillon cubes. Hot sauce. Tabasco sauce. Marinades. Taco seasonings. Relishes.  Fats and Oils  Butter, stick margarine, lard, shortening, ghee, and bacon fat. Coconut, palm kernel, or palm oils. Regular salad dressings.  Other  Pickles and olives. Salted popcorn and pretzels.  The items listed above may not be a complete list of foods and beverages to avoid. Contact your dietitian for more information.  WHERE CAN I FIND MORE INFORMATION?  National Heart, [employer], and [employer] Institute: www.nhlbi.nih.gov/health/health-topics/topics/dash/     This information is not intended to replace advice given to you by your health care provider. Make sure you discuss any questions you have with your health care provider.     Document Released: 11/28/2011 Document Revised: 12/30/2014 Document Reviewed: 10/13/2013  [employer]vier Interactive Patient Education ©2016 [employer]

## 2016-11-04 NOTE — Progress Notes (Signed)
BP (!) 148/91 (BP Location: Left Arm, Cuff Size: Normal)   Pulse 76   Temp 98.5 F (36.9 C)   Ht 5' 10.8" (1.798 m)   Wt 204 lb (92.5 kg)   SpO2 98%   BMI 28.61 kg/m    Subjective:    Patient ID: George Hawkins, male    DOB: 02-15-1961, 55 y.o.   MRN: [id number]  HPI: George Hawkins is a 55 y.o. male  Chief Complaint  Patient presents with  . Annual Exam   Hypertension Using medications without difficulty Average home BPs OK at the eye doctor  No problems or lightheadedness No chest pain with exertion or shortness of breath No Edema   Hyperlipidemia Using medications without problems: No Muscle aches  Diet compliance: watches what he eats.   Exercise: occasionally  Family History  Problem Relation Age of Onset  . Thyroid disease Brother   . Thyroid disease Sister   . Thyroid disease Brother   . Heart attack Neg Hx   . Hyperlipidemia Neg Hx   . Hypertension Neg Hx   . Sudden death Neg Hx    Social History   Social History  . Marital status: Married    Spouse name: N/A  . Number of children: N/A  . Years of education: N/A   Social History Main Topics  . Smoking status: Never Smoker  . Smokeless tobacco: Never Used  . Alcohol use 8.4 oz/week    14 Cans of beer per week  . Drug use: No  . Sexual activity: Yes   Other Topics Concern  . None   Social History Narrative  . None   Past Medical History:  Diagnosis Date  . Gout   . Hyperlipidemia   . Hypertension   . Hypothyroidism    Past Surgical History:  Procedure Laterality Date  . TENDON REPAIR Right    Hand  . VASECTOMY       Relevant past medical, surgical, family and social history reviewed and updated as indicated. Interim medical history since our last visit reviewed. Allergies and medications reviewed and updated.  Review of Systems  Per HPI unless specifically indicated above     Objective:    BP (!) 148/91 (BP Location: Left Arm, Cuff Size: Normal)   Pulse 76    Temp 98.5 F (36.9 C)   Ht 5' 10.8" (1.798 m)   Wt 204 lb (92.5 kg)   SpO2 98%   BMI 28.61 kg/m   Wt Readings from Last 3 Encounters:  11/04/16 204 lb (92.5 kg)  04/29/16 200 lb 3.2 oz (90.8 kg)  10/30/15 205 lb 9.6 oz (93.3 kg)    Physical Exam  Constitutional: He is oriented to person, place, and time. He appears well-developed and well-nourished.  HENT:  Head: Normocephalic.  Right Ear: Tympanic membrane, external ear and ear canal normal.  Left Ear: Tympanic membrane, external ear and ear canal normal.  Mouth/Throat: Uvula is midline, oropharynx is clear and moist and mucous membranes are normal.  Eyes: Pupils are equal, round, and reactive to light.  Cardiovascular: Normal rate, regular rhythm and normal heart sounds.  Exam reveals no gallop and no friction rub.   No murmur heard. Pulmonary/Chest: Effort normal and breath sounds normal. No respiratory distress.  Abdominal: Soft. Bowel sounds are normal. He exhibits no distension. There is no tenderness.  Musculoskeletal: Normal range of motion.  Neurological: He is alert and oriented to person, place, and time. He has normal reflexes.  Skin: Skin is warm and dry.  Psychiatric: He has a normal mood and affect. His behavior is normal. Judgment and thought content normal.    Results for orders placed or performed in visit on 04/29/16  Comprehensive metabolic panel  Result Value Ref Range   Glucose 107 (H) 65 - 99 mg/dL   BUN 16 6 - 24 mg/dL   Creatinine, Ser 1.12 0.76 - 1.27 mg/dL   GFR calc non Af Amer 75 >59 mL/min/1.73   GFR calc Af Amer 87 >59 mL/min/1.73   BUN/Creatinine Ratio 14 9 - 20   Sodium 139 134 - 144 mmol/L   Potassium 4.7 3.5 - 5.2 mmol/L   Chloride 100 96 - 106 mmol/L   CO2 23 18 - 29 mmol/L   Calcium 9.4 8.7 - 10.2 mg/dL   Total Protein 6.8 6.0 - 8.5 g/dL   Albumin 4.7 3.5 - 5.5 g/dL   Globulin, Total 2.1 1.5 - 4.5 g/dL   Albumin/Globulin Ratio 2.2 1.2 - 2.2   Bilirubin Total 0.6 0.0 - 1.2 mg/dL    Alkaline Phosphatase 69 39 - 117 IU/L   AST 22 0 - 40 IU/L   ALT 33 0 - 44 IU/L  Lipid Panel w/o Chol/HDL Ratio  Result Value Ref Range   Cholesterol, Total 185 100 - 199 mg/dL   Triglycerides 451 (H) 0 - 149 mg/dL   HDL 36 (L) >39 mg/dL   VLDL Cholesterol Cal Comment 5 - 40 mg/dL   LDL Calculated Comment 0 - 99 mg/dL  Bayer DCA Hb A1c Waived  Result Value Ref Range   Bayer DCA Hb A1c Waived 5.4 <7.0 %  Uric acid  Result Value Ref Range   Uric Acid 11.0 (H) 3.7 - 8.6 mg/dL  Microalbumin, Urine Waived  Result Value Ref Range   Microalb, Ur Waived 10 0 - 19 mg/L   Creatinine, Urine Waived 200 10 - 300 mg/dL   Microalb/Creat Ratio <30 <30 mg/g      Assessment & Plan:   Problem List Items Addressed This Visit      Unprioritized   Adult hypothyroidism - Primary   Relevant Orders   TSH   Hyperlipidemia    Check lipid panel      Relevant Orders   Lipid Panel w/o Chol/HDL Ratio   Hypertension    A little high.  Work on diet and exercise and monitor it at home.  Recheck in 6 months      Relevant Orders   Uric acid   Microalbumin, Urine Waived   Comprehensive metabolic panel    Other Visit Diagnoses    Routine general medical examination at a health care facility       Relevant Orders   CBC with Differential/Platelet   PSA       Follow up plan: Return in about 6 months (around 05/04/2017).

## 2016-11-04 NOTE — Assessment & Plan Note (Signed)
Check lipid panel  

## 2016-11-05 LAB — CBC WITH DIFFERENTIAL/PLATELET
Basophils Absolute: 0.1 10*3/uL (ref 0.0–0.2)
Basos: 1 %
EOS (ABSOLUTE): 0.3 10*3/uL (ref 0.0–0.4)
EOS: 4 %
HEMATOCRIT: 40.1 % (ref 37.5–51.0)
HEMOGLOBIN: 14 g/dL (ref 12.6–17.7)
IMMATURE GRANS (ABS): 0 10*3/uL (ref 0.0–0.1)
IMMATURE GRANULOCYTES: 0 %
LYMPHS ABS: 1.3 10*3/uL (ref 0.7–3.1)
LYMPHS: 20 %
MCH: 32.7 pg (ref 26.6–33.0)
MCHC: 34.9 g/dL (ref 31.5–35.7)
MCV: 94 fL (ref 79–97)
MONOCYTES: 8 %
Monocytes Absolute: 0.6 10*3/uL (ref 0.1–0.9)
Neutrophils Absolute: 4.6 10*3/uL (ref 1.4–7.0)
Neutrophils: 67 %
Platelets: 175 10*3/uL (ref 150–379)
RBC: 4.28 x10E6/uL (ref 4.14–5.80)
RDW: 12.6 % (ref 12.3–15.4)
WBC: 6.8 10*3/uL (ref 3.4–10.8)

## 2016-11-05 LAB — COMPREHENSIVE METABOLIC PANEL
ALBUMIN: 4.5 g/dL (ref 3.5–5.5)
ALT: 28 IU/L (ref 0–44)
AST: 19 IU/L (ref 0–40)
Albumin/Globulin Ratio: 1.9 (ref 1.2–2.2)
Alkaline Phosphatase: 77 IU/L (ref 39–117)
BUN / CREAT RATIO: 16 (ref 9–20)
BUN: 17 mg/dL (ref 6–24)
Bilirubin Total: 0.7 mg/dL (ref 0.0–1.2)
CALCIUM: 9.5 mg/dL (ref 8.7–10.2)
CO2: 23 mmol/L (ref 18–29)
CREATININE: 1.06 mg/dL (ref 0.76–1.27)
Chloride: 97 mmol/L (ref 96–106)
GFR calc non Af Amer: 80 mL/min/{1.73_m2} (ref >59)
GFR, EST AFRICAN AMERICAN: 92 mL/min/{1.73_m2} (ref >59)
GLOBULIN, TOTAL: 2.4 g/dL (ref 1.5–4.5)
Glucose: 100 mg/dL — ABNORMAL HIGH (ref 65–99)
Potassium: 4.8 mmol/L (ref 3.5–5.2)
SODIUM: 137 mmol/L (ref 134–144)
TOTAL PROTEIN: 6.9 g/dL (ref 6.0–8.5)

## 2016-11-05 LAB — PSA: Prostate Specific Ag, Serum: 0.6 ng/mL (ref 0.0–4.0)

## 2016-11-05 LAB — LIPID PANEL W/O CHOL/HDL RATIO
Cholesterol, Total: 187 mg/dL (ref 100–199)
HDL: 37 mg/dL — ABNORMAL LOW (ref >39)
TRIGLYCERIDES: 678 mg/dL — AB (ref 0–149)

## 2016-11-05 LAB — TSH: TSH: 0.91 u[IU]/mL (ref 0.450–4.500)

## 2016-11-05 LAB — URIC ACID: Uric Acid: 10.1 mg/dL — ABNORMAL HIGH (ref 3.7–8.6)

## 2016-11-08 ENCOUNTER — Ambulatory Visit (INDEPENDENT_AMBULATORY_CARE_PROVIDER_SITE_OTHER): Payer: 59 | Admitting: Unknown Physician Specialty

## 2016-11-08 ENCOUNTER — Encounter: Payer: Self-pay | Admitting: Unknown Physician Specialty

## 2016-11-08 DIAGNOSIS — E781 Pure hyperglyceridemia: Secondary | ICD-10-CM | POA: Diagnosis not present

## 2016-11-08 MED ORDER — FENOFIBRATE 145 MG PO TABS: 145.0000 mg | ORAL_TABLET | Freq: Every day | ORAL | 1 refills | Status: DC

## 2016-11-08 MED FILL — FENOFIBRATE 145 MG TABLET: 145 | 90 days supply | Qty: 90 | Fill #0

## 2016-11-08 NOTE — Progress Notes (Signed)
BP (!) 152/87 (BP Location: Left Arm, Patient Position: Sitting, Cuff Size: Normal)   Pulse 66   Temp 97.7 F (36.5 C)   Wt 206 lb 3.2 oz (93.5 kg) Comment: pt had boots on  SpO2 97%   BMI 28.92 kg/m    Subjective:    Patient ID: George Hawkins, male    DOB: 1961-04-12, 55 y.o.   MRN: [id number]  HPI: George Hawkins is a 55 y.o. male  Chief Complaint  Patient presents with  . Lab Results    pt here to go over lab results with Malachy Mood   Hypertriglycerides States he drinks "2-3" beers/day.  States he drinks only diet soft drinks.  Admits lately he has been into [employer].  Eats 4 slices of bread/day.    Relevant past medical, surgical, family and social history reviewed and updated as indicated. Interim medical history since our last visit reviewed. Allergies and medications reviewed and updated.  Review of Systems  Per HPI unless specifically indicated above     Objective:    BP (!) 152/87 (BP Location: Left Arm, Patient Position: Sitting, Cuff Size: Normal)   Pulse 66   Temp 97.7 F (36.5 C)   Wt 206 lb 3.2 oz (93.5 kg) Comment: pt had boots on  SpO2 97%   BMI 28.92 kg/m   Wt Readings from Last 3 Encounters:  11/08/16 206 lb 3.2 oz (93.5 kg)  11/04/16 204 lb (92.5 kg)  04/29/16 200 lb 3.2 oz (90.8 kg)    Physical Exam  Constitutional: He is oriented to person, place, and time. He appears well-developed and well-nourished. No distress.  HENT:  Head: Normocephalic and atraumatic.  Eyes: Conjunctivae and lids are normal. Right eye exhibits no discharge. Left eye exhibits no discharge. No scleral icterus.  Cardiovascular: Normal rate.   Pulmonary/Chest: Effort normal.  Abdominal: Normal appearance. There is no splenomegaly or hepatomegaly.  Musculoskeletal: Normal range of motion.  Neurological: He is alert and oriented to person, place, and time.  Skin: Skin is intact. No rash noted. No pallor.  Psychiatric: He has a normal mood and affect. His  behavior is normal. Judgment and thought content normal.    Results for orders placed or performed in visit on 11/04/16  Uric acid  Result Value Ref Range   Uric Acid 10.1 (H) 3.7 - 8.6 mg/dL  Microalbumin, Urine Waived  Result Value Ref Range   Microalb, Ur Waived 10 0 - 19 mg/L   Creatinine, Urine Waived 200 10 - 300 mg/dL   Microalb/Creat Ratio <30 <30 mg/g  Lipid Panel w/o Chol/HDL Ratio  Result Value Ref Range   Cholesterol, Total 187 100 - 199 mg/dL   Triglycerides 678 (HH) 0 - 149 mg/dL   HDL 37 (L) >39 mg/dL   VLDL Cholesterol Cal Comment 5 - 40 mg/dL   LDL Calculated Comment 0 - 99 mg/dL  CBC with Differential/Platelet  Result Value Ref Range   WBC 6.8 3.4 - 10.8 x10E3/uL   RBC 4.28 4.14 - 5.80 x10E6/uL   Hemoglobin 14.0 12.6 - 17.7 g/dL   Hematocrit 40.1 37.5 - 51.0 %   MCV 94 79 - 97 fL   MCH 32.7 26.6 - 33.0 pg   MCHC 34.9 31.5 - 35.7 g/dL   RDW 12.6 12.3 - 15.4 %   Platelets 175 150 - 379 x10E3/uL   Neutrophils 67 Not Estab. %   Lymphs 20 Not Estab. %   Monocytes 8 Not Estab. %  Eos 4 Not Estab. %   Basos 1 Not Estab. %   Neutrophils Absolute 4.6 1.4 - 7.0 x10E3/uL   Lymphocytes Absolute 1.3 0.7 - 3.1 x10E3/uL   Monocytes Absolute 0.6 0.1 - 0.9 x10E3/uL   EOS (ABSOLUTE) 0.3 0.0 - 0.4 x10E3/uL   Basophils Absolute 0.1 0.0 - 0.2 x10E3/uL   Immature Granulocytes 0 Not Estab. %   Immature Grans (Abs) 0.0 0.0 - 0.1 x10E3/uL  Comprehensive metabolic panel  Result Value Ref Range   Glucose 100 (H) 65 - 99 mg/dL   BUN 17 6 - 24 mg/dL   Creatinine, Ser 1.06 0.76 - 1.27 mg/dL   GFR calc non Af Amer 80 >59 mL/min/1.73   GFR calc Af Amer 92 >59 mL/min/1.73   BUN/Creatinine Ratio 16 9 - 20   Sodium 137 134 - 144 mmol/L   Potassium 4.8 3.5 - 5.2 mmol/L   Chloride 97 96 - 106 mmol/L   CO2 23 18 - 29 mmol/L   Calcium 9.5 8.7 - 10.2 mg/dL   Total Protein 6.9 6.0 - 8.5 g/dL   Albumin 4.5 3.5 - 5.5 g/dL   Globulin, Total 2.4 1.5 - 4.5 g/dL   Albumin/Globulin Ratio  1.9 1.2 - 2.2   Bilirubin Total 0.7 0.0 - 1.2 mg/dL   Alkaline Phosphatase 77 39 - 117 IU/L   AST 19 0 - 40 IU/L   ALT 28 0 - 44 IU/L  TSH  Result Value Ref Range   TSH 0.910 0.450 - 4.500 uIU/mL  PSA  Result Value Ref Range   Prostate Specific Ag, Serum 0.6 0.0 - 4.0 ng/mL      Assessment & Plan:   Problem List Items Addressed This Visit      Unprioritized   Hyperlipidemia    Still taking fish oil.  Cut down to 1 beer/day.  Limit sugar and bread.  Start on Fenofibrate.        Relevant Medications   fenofibrate (TRICOR) 145 MG tablet       Follow up plan: Return in about 3 months (around 02/08/2017).

## 2016-11-08 NOTE — Assessment & Plan Note (Signed)
Still taking fish oil.  Cut down to 1 beer/day.  Limit sugar and bread.  Start on Fenofibrate.

## 2016-11-20 ENCOUNTER — Other Ambulatory Visit: Payer: Self-pay | Admitting: Unknown Physician Specialty

## 2016-11-20 MED FILL — ATENOLOL 100 MG TABLET: 100 | 30 days supply | Qty: 30 | Fill #0

## 2016-11-26 DIAGNOSIS — L280 Lichen simplex chronicus: Secondary | ICD-10-CM | POA: Diagnosis not present

## 2016-11-26 DIAGNOSIS — D1801 Hemangioma of skin and subcutaneous tissue: Secondary | ICD-10-CM | POA: Diagnosis not present

## 2016-11-26 DIAGNOSIS — L309 Dermatitis, unspecified: Secondary | ICD-10-CM | POA: Diagnosis not present

## 2016-11-26 DIAGNOSIS — L821 Other seborrheic keratosis: Secondary | ICD-10-CM | POA: Diagnosis not present

## 2016-12-09 ENCOUNTER — Other Ambulatory Visit: Payer: Self-pay | Admitting: Unknown Physician Specialty

## 2016-12-09 MED FILL — LEVOTHYROXINE 150 MCG TAB: 150 | 90 days supply | Qty: 90 | Fill #0

## 2016-12-25 MED FILL — ATENOLOL 100 MG TABLET: 100 | 30 days supply | Qty: 30 | Fill #1

## 2017-01-02 ENCOUNTER — Other Ambulatory Visit: Payer: Self-pay | Admitting: Unknown Physician Specialty

## 2017-01-02 MED FILL — ATORVASTATIN 10 MG TABLET: 10 | 90 days supply | Qty: 90 | Fill #0

## 2017-01-02 NOTE — Telephone Encounter (Signed)
Routing to provider. Appt 02/07/17.

## 2017-01-27 MED FILL — ATENOLOL 100 MG TAB: 100 | 30 days supply | Qty: 30 | Fill #2

## 2017-02-07 ENCOUNTER — Ambulatory Visit (INDEPENDENT_AMBULATORY_CARE_PROVIDER_SITE_OTHER): Payer: 59 | Admitting: Unknown Physician Specialty

## 2017-02-07 ENCOUNTER — Encounter: Payer: Self-pay | Admitting: Unknown Physician Specialty

## 2017-02-07 VITALS — BP 120/80 | HR 67 | Temp 98.2°F | Ht 71.2 in | Wt 206.6 lb

## 2017-02-07 DIAGNOSIS — I10 Essential (primary) hypertension: Secondary | ICD-10-CM

## 2017-02-07 DIAGNOSIS — E781 Pure hyperglyceridemia: Secondary | ICD-10-CM | POA: Diagnosis not present

## 2017-02-07 LAB — LIPID PANEL PICCOLO, WAIVED
CHOL/HDL RATIO PICCOLO,WAIVE: 2.8 mg/dL
CHOLESTEROL PICCOLO, WAIVED: 167 mg/dL (ref <200)
HDL CHOL PICCOLO, WAIVED: 59 mg/dL (ref >59)
LDL Chol Calc Piccolo Waived: 82 mg/dL (ref <100)
Triglycerides Piccolo,Waived: 129 mg/dL (ref <150)
VLDL Chol Calc Piccolo,Waive: 26 mg/dL (ref <30)

## 2017-02-07 MED ORDER — FENOFIBRATE 145 MG PO TABS: 145.0000 mg | ORAL_TABLET | Freq: Every day | ORAL | 1 refills | Status: DC

## 2017-02-07 MED ORDER — ATENOLOL 100 MG PO TABS: 100.0000 mg | ORAL_TABLET | Freq: Every day | ORAL | 1 refills | Status: DC

## 2017-02-07 MED ORDER — ATORVASTATIN CALCIUM 10 MG PO TABS: 10.0000 mg | ORAL_TABLET | Freq: Every day | ORAL | 1 refills | Status: DC

## 2017-02-07 MED FILL — FENOFIBRATE 145 MG TABLET: 145 | 90 days supply | Qty: 90 | Fill #0

## 2017-02-07 NOTE — Assessment & Plan Note (Signed)
Triglycerides down to 129 from 678 with medications and exercise.  Continue present treatment

## 2017-02-07 NOTE — Progress Notes (Signed)
BP 120/80 (BP Location: Left Arm, Patient Position: Sitting, Cuff Size: Large)   Pulse 67   Temp 98.2 F (36.8 C)   Ht 5' 11.2" (1.808 m) Comment: pt had boots on  Wt 206 lb 9.6 oz (93.7 kg) Comment: pt had boots on  BMI 28.65 kg/m    Subjective:    Patient ID: George Hawkins, male    DOB: 26-May-1961, 56 y.o.   MRN: [id number]  HPI: George Hawkins is a 56 y.o. male  Chief Complaint  Patient presents with  . Hyperlipidemia  . Hypertension  . Hypothyroidism   Hypertension Using medications without difficulty Average home BPs   No problems or lightheadedness No chest pain with exertion or shortness of breath No Edema   Hyperlipidemia Using medications without problems: No Muscle aches  Diet compliance:has changed his diet Exercise: exercising    Relevant past medical, surgical, family and social history reviewed and updated as indicated. Interim medical history since our last visit reviewed. Allergies and medications reviewed and updated.  Review of Systems  Per HPI unless specifically indicated above     Objective:    BP 120/80 (BP Location: Left Arm, Patient Position: Sitting, Cuff Size: Large)   Pulse 67   Temp 98.2 F (36.8 C)   Ht 5' 11.2" (1.808 m) Comment: pt had boots on  Wt 206 lb 9.6 oz (93.7 kg) Comment: pt had boots on  BMI 28.65 kg/m   Wt Readings from Last 3 Encounters:  02/07/17 206 lb 9.6 oz (93.7 kg)  11/08/16 206 lb 3.2 oz (93.5 kg)  11/04/16 204 lb (92.5 kg)    Physical Exam  Constitutional: He is oriented to person, place, and time. He appears well-developed and well-nourished. No distress.  HENT:  Head: Normocephalic and atraumatic.  Eyes: Conjunctivae and lids are normal. Right eye exhibits no discharge. Left eye exhibits no discharge. No scleral icterus.  Neck: Normal range of motion. Neck supple. No JVD present. Carotid bruit is not present.  Cardiovascular: Normal rate, regular rhythm and normal heart sounds.     Pulmonary/Chest: Effort normal and breath sounds normal. No respiratory distress.  Abdominal: Normal appearance. There is no splenomegaly or hepatomegaly.  Musculoskeletal: Normal range of motion.  Neurological: He is alert and oriented to person, place, and time.  Skin: Skin is warm, dry and intact. No rash noted. No pallor.  Psychiatric: He has a normal mood and affect. His behavior is normal. Judgment and thought content normal.    Results for orders placed or performed in visit on 11/04/16  Uric acid  Result Value Ref Range   Uric Acid 10.1 (H) 3.7 - 8.6 mg/dL  Microalbumin, Urine Waived  Result Value Ref Range   Microalb, Ur Waived 10 0 - 19 mg/L   Creatinine, Urine Waived 200 10 - 300 mg/dL   Microalb/Creat Ratio <30 <30 mg/g  Lipid Panel w/o Chol/HDL Ratio  Result Value Ref Range   Cholesterol, Total 187 100 - 199 mg/dL   Triglycerides 678 (HH) 0 - 149 mg/dL   HDL 37 (L) >39 mg/dL   VLDL Cholesterol Cal Comment 5 - 40 mg/dL   LDL Calculated Comment 0 - 99 mg/dL  CBC with Differential/Platelet  Result Value Ref Range   WBC 6.8 3.4 - 10.8 x10E3/uL   RBC 4.28 4.14 - 5.80 x10E6/uL   Hemoglobin 14.0 12.6 - 17.7 g/dL   Hematocrit 40.1 37.5 - 51.0 %   MCV 94 79 - 97 fL  MCH 32.7 26.6 - 33.0 pg   MCHC 34.9 31.5 - 35.7 g/dL   RDW 12.6 12.3 - 15.4 %   Platelets 175 150 - 379 x10E3/uL   Neutrophils 67 Not Estab. %   Lymphs 20 Not Estab. %   Monocytes 8 Not Estab. %   Eos 4 Not Estab. %   Basos 1 Not Estab. %   Neutrophils Absolute 4.6 1.4 - 7.0 x10E3/uL   Lymphocytes Absolute 1.3 0.7 - 3.1 x10E3/uL   Monocytes Absolute 0.6 0.1 - 0.9 x10E3/uL   EOS (ABSOLUTE) 0.3 0.0 - 0.4 x10E3/uL   Basophils Absolute 0.1 0.0 - 0.2 x10E3/uL   Immature Granulocytes 0 Not Estab. %   Immature Grans (Abs) 0.0 0.0 - 0.1 x10E3/uL  Comprehensive metabolic panel  Result Value Ref Range   Glucose 100 (H) 65 - 99 mg/dL   BUN 17 6 - 24 mg/dL   Creatinine, Ser 1.06 0.76 - 1.27 mg/dL   GFR calc  non Af Amer 80 >59 mL/min/1.73   GFR calc Af Amer 92 >59 mL/min/1.73   BUN/Creatinine Ratio 16 9 - 20   Sodium 137 134 - 144 mmol/L   Potassium 4.8 3.5 - 5.2 mmol/L   Chloride 97 96 - 106 mmol/L   CO2 23 18 - 29 mmol/L   Calcium 9.5 8.7 - 10.2 mg/dL   Total Protein 6.9 6.0 - 8.5 g/dL   Albumin 4.5 3.5 - 5.5 g/dL   Globulin, Total 2.4 1.5 - 4.5 g/dL   Albumin/Globulin Ratio 1.9 1.2 - 2.2   Bilirubin Total 0.7 0.0 - 1.2 mg/dL   Alkaline Phosphatase 77 39 - 117 IU/L   AST 19 0 - 40 IU/L   ALT 28 0 - 44 IU/L  TSH  Result Value Ref Range   TSH 0.910 0.450 - 4.500 uIU/mL  PSA  Result Value Ref Range   Prostate Specific Ag, Serum 0.6 0.0 - 4.0 ng/mL         Assessment & Plan:   Problem List Items Addressed This Visit      Unprioritized   Hyperlipidemia    Triglycerides down to 129 from 678 with medications and exercise.  Continue present treatment      Relevant Medications   fenofibrate (TRICOR) 145 MG tablet   atenolol (TENORMIN) 100 MG tablet   atorvastatin (LIPITOR) 10 MG tablet   Other Relevant Orders   Lipid Panel Piccolo, Waived   Comprehensive metabolic panel   Uric acid   Hypertension - Primary    Stable, continue present medications.       Relevant Medications   fenofibrate (TRICOR) 145 MG tablet   atenolol (TENORMIN) 100 MG tablet   atorvastatin (LIPITOR) 10 MG tablet       Follow up plan: Return in about 6 months (around 08/07/2017).

## 2017-02-07 NOTE — Assessment & Plan Note (Signed)
Stable, continue present medications.   

## 2017-02-08 LAB — COMPREHENSIVE METABOLIC PANEL
A/G RATIO: 2 (ref 1.2–2.2)
ALK PHOS: 38 IU/L — AB (ref 39–117)
ALT: 50 IU/L — AB (ref 0–44)
AST: 43 IU/L — AB (ref 0–40)
Albumin: 4.5 g/dL (ref 3.5–5.5)
BUN/Creatinine Ratio: 16 (ref 9–20)
BUN: 20 mg/dL (ref 6–24)
Bilirubin Total: 0.8 mg/dL (ref 0.0–1.2)
CO2: 24 mmol/L (ref 18–29)
CREATININE: 1.25 mg/dL (ref 0.76–1.27)
Calcium: 9.4 mg/dL (ref 8.7–10.2)
Chloride: 98 mmol/L (ref 96–106)
GFR calc Af Amer: 76 mL/min/{1.73_m2} (ref >59)
GFR calc non Af Amer: 65 mL/min/{1.73_m2} (ref >59)
GLOBULIN, TOTAL: 2.3 g/dL (ref 1.5–4.5)
Glucose: 81 mg/dL (ref 65–99)
POTASSIUM: 4.3 mmol/L (ref 3.5–5.2)
SODIUM: 138 mmol/L (ref 134–144)
Total Protein: 6.8 g/dL (ref 6.0–8.5)

## 2017-02-08 LAB — URIC ACID: URIC ACID: 6 mg/dL (ref 3.7–8.6)

## 2017-02-10 ENCOUNTER — Encounter: Payer: Self-pay | Admitting: Unknown Physician Specialty

## 2017-02-27 MED FILL — ATENOLOL 100 MG TAB: 100 | 30 days supply | Qty: 30 | Fill #3

## 2017-03-11 MED FILL — LEVOTHYROXINE 150 MCG TAB: 150 | 90 days supply | Qty: 90 | Fill #1

## 2017-04-02 MED FILL — ATENOLOL 100 MG TAB: 100 | 30 days supply | Qty: 30 | Fill #4

## 2017-04-08 MED FILL — ATORVASTATIN 10 MG TABLET: 10 | 90 days supply | Qty: 90 | Fill #0

## 2017-05-05 ENCOUNTER — Ambulatory Visit: Payer: 59 | Admitting: Unknown Physician Specialty

## 2017-05-05 MED FILL — ATENOLOL 100 MG TAB: 100 | 30 days supply | Qty: 30 | Fill #5

## 2017-05-20 MED FILL — FENOFIBRATE 145 MG TAB: 145 | 90 days supply | Qty: 90 | Fill #1

## 2017-06-11 MED FILL — ATENOLOL 100 MG TAB: 100 | 30 days supply | Qty: 30 | Fill #0

## 2017-06-17 MED FILL — LEVOTHYROXINE 150 MCG TAB: 150 | 90 days supply | Qty: 90 | Fill #2

## 2017-07-09 MED FILL — ATORVASTATIN 10 MG TABLET: 10 | 90 days supply | Qty: 90 | Fill #1

## 2017-07-09 MED FILL — ATENOLOL 100 MG TAB: 100 | 30 days supply | Qty: 30 | Fill #1

## 2017-08-08 ENCOUNTER — Ambulatory Visit (INDEPENDENT_AMBULATORY_CARE_PROVIDER_SITE_OTHER): Payer: 59 | Admitting: Unknown Physician Specialty

## 2017-08-08 ENCOUNTER — Encounter: Payer: Self-pay | Admitting: Unknown Physician Specialty

## 2017-08-08 DIAGNOSIS — E039 Hypothyroidism, unspecified: Secondary | ICD-10-CM | POA: Diagnosis not present

## 2017-08-08 DIAGNOSIS — E781 Pure hyperglyceridemia: Secondary | ICD-10-CM

## 2017-08-08 DIAGNOSIS — M1A9XX0 Chronic gout, unspecified, without tophus (tophi): Secondary | ICD-10-CM | POA: Diagnosis not present

## 2017-08-08 DIAGNOSIS — I10 Essential (primary) hypertension: Secondary | ICD-10-CM

## 2017-08-08 MED ORDER — ATENOLOL 100 MG PO TABS: 100.0000 mg | ORAL_TABLET | Freq: Every day | ORAL | 1 refills | Status: DC

## 2017-08-08 MED ORDER — INDOMETHACIN 50 MG PO CAPS
50.0000 mg | ORAL_CAPSULE | Freq: Three times a day (TID) | ORAL | 2 refills | Status: DC | PRN
Start: 2017-08-08 — End: 2018-02-09

## 2017-08-08 MED ORDER — FENOFIBRATE 145 MG PO TABS: 145.0000 mg | ORAL_TABLET | Freq: Every day | ORAL | 1 refills | Status: DC

## 2017-08-08 MED ORDER — LEVOTHYROXINE SODIUM 150 MCG PO TABS: 150.0000 ug | ORAL_TABLET | Freq: Every day | ORAL | 3 refills | Status: DC

## 2017-08-08 MED ORDER — ATORVASTATIN CALCIUM 10 MG PO TABS: 10.0000 mg | ORAL_TABLET | Freq: Every day | ORAL | 1 refills | Status: DC

## 2017-08-08 MED FILL — INDOMETHACIN 50 MG CAPSULE: 50 | 10 days supply | Qty: 30 | Fill #0

## 2017-08-08 MED FILL — ATENOLOL 100 MG TAB: 100 | 90 days supply | Qty: 90 | Fill #0

## 2017-08-08 NOTE — Progress Notes (Signed)
BP 129/77   Pulse 64   Temp 98.1 F (36.7 C)   Wt 215 lb 3.2 oz (97.6 kg)   SpO2 98%   BMI 29.85 kg/m    Subjective:    Patient ID: George Hawkins, male    DOB: [birth year], 56 y.o.   MRN: [id number]  HPI: George Hawkins is a 55 y.o. male  Chief Complaint  Patient presents with  . Hyperlipidemia  . Hypertension  . Hypothyroidism   Hypertension Using medications without difficulty Average home BPs Not checking  No problems or lightheadedness No chest pain with exertion or shortness of breath No Edema   Hyperlipidemia Using medications without problems: No Muscle aches  Diet compliance:Exercise:No real changes  Hypothyroid Euthyroid last visit  Gout Has had no gout flares.  He would like a refill of something.  Not taking Allopurinol and just making diet changes  Relevant past medical, surgical, family and social history reviewed and updated as indicated. Interim medical history since our last visit reviewed. Allergies and medications reviewed and updated.  Review of Systems  Per HPI unless specifically indicated above     Objective:    BP 129/77   Pulse 64   Temp 98.1 F (36.7 C)   Wt 215 lb 3.2 oz (97.6 kg)   SpO2 98%   BMI 29.85 kg/m   Wt Readings from Last 3 Encounters:  08/08/17 215 lb 3.2 oz (97.6 kg)  02/07/17 206 lb 9.6 oz (93.7 kg)  11/08/16 206 lb 3.2 oz (93.5 kg)    Physical Exam  Constitutional: He is oriented to person, place, and time. He appears well-developed and well-nourished. No distress.  HENT:  Head: Normocephalic and atraumatic.  Eyes: Conjunctivae and lids are normal. Right eye exhibits no discharge. Left eye exhibits no discharge. No scleral icterus.  Neck: Normal range of motion. Neck supple. No JVD present. Carotid bruit is not present.  Cardiovascular: Normal rate, regular rhythm and normal heart sounds.   Pulmonary/Chest: Effort normal and breath sounds normal. No respiratory distress.  Abdominal: Normal  appearance. There is no splenomegaly or hepatomegaly.  Musculoskeletal: Normal range of motion.  Neurological: He is alert and oriented to person, place, and time.  Skin: Skin is warm, dry and intact. No rash noted. No pallor.  Psychiatric: He has a normal mood and affect. His behavior is normal. Judgment and thought content normal.    Results for orders placed or performed in visit on 02/07/17  Lipid Panel Piccolo, [employer]  Result Value Ref Range   Cholesterol Piccolo, Waived 167 <200 mg/dL   HDL Chol Piccolo, Waived 59 >59 mg/dL   Triglycerides Piccolo,Waived 129 <150 mg/dL   Chol/HDL Ratio Piccolo,Waive 2.8 mg/dL   LDL Chol Calc Piccolo Waived 82 <100 mg/dL   VLDL Chol Calc Piccolo,Waive 26 <30 mg/dL  Comprehensive metabolic panel  Result Value Ref Range   Glucose 81 65 - 99 mg/dL   BUN 20 6 - 24 mg/dL   Creatinine, Ser 1.25 0.76 - 1.27 mg/dL   GFR calc non Af Amer 65 >59 mL/min/1.73   GFR calc Af Amer 76 >59 mL/min/1.73   BUN/Creatinine Ratio 16 9 - 20   Sodium 138 134 - 144 mmol/L   Potassium 4.3 3.5 - 5.2 mmol/L   Chloride 98 96 - 106 mmol/L   CO2 24 18 - 29 mmol/L   Calcium 9.4 8.7 - 10.2 mg/dL   Total Protein 6.8 6.0 - 8.5 g/dL   Albumin 4.5 3.5 -  5.5 g/dL   Globulin, Total 2.3 1.5 - 4.5 g/dL   Albumin/Globulin Ratio 2.0 1.2 - 2.2   Bilirubin Total 0.8 0.0 - 1.2 mg/dL   Alkaline Phosphatase 38 (L) 39 - 117 IU/L   AST 43 (H) 0 - 40 IU/L   ALT 50 (H) 0 - 44 IU/L  Uric acid  Result Value Ref Range   Uric Acid 6.0 3.7 - 8.6 mg/dL      Assessment & Plan:   Problem List Items Addressed This Visit      Unprioritized   Adult hypothyroidism    Check TSH      Relevant Medications   atenolol (TENORMIN) 100 MG tablet   levothyroxine (SYNTHROID, LEVOTHROID) 150 MCG tablet   Other Relevant Orders   Thyroid Panel With TSH   Chronic gout    Rx for Indocin for the occasional flare      Relevant Medications   indomethacin (INDOCIN) 50 MG capsule   Hyperlipidemia     Stable, continue present medications.        Relevant Medications   fenofibrate (TRICOR) 145 MG tablet   atenolol (TENORMIN) 100 MG tablet   atorvastatin (LIPITOR) 10 MG tablet   Other Relevant Orders   Lipid Panel w/o Chol/HDL Ratio   Hypertension    Stable, continue present medications.        Relevant Medications   fenofibrate (TRICOR) 145 MG tablet   atenolol (TENORMIN) 100 MG tablet   atorvastatin (LIPITOR) 10 MG tablet   Other Relevant Orders   Comprehensive metabolic panel   Lipid Panel w/o Chol/HDL Ratio       Follow up plan: Return in about 6 months (around 02/08/2018) for physical.

## 2017-08-08 NOTE — Assessment & Plan Note (Signed)
Stable, continue present medications.   

## 2017-08-08 NOTE — Assessment & Plan Note (Signed)
Check TSH 

## 2017-08-08 NOTE — Assessment & Plan Note (Signed)
Rx for Indocin for the occasional flare

## 2017-08-09 LAB — COMPREHENSIVE METABOLIC PANEL
A/G RATIO: 2.1 (ref 1.2–2.2)
ALK PHOS: 30 IU/L — AB (ref 39–117)
ALT: 27 IU/L (ref 0–44)
AST: 27 IU/L (ref 0–40)
Albumin: 4.5 g/dL (ref 3.5–5.5)
BILIRUBIN TOTAL: 0.7 mg/dL (ref 0.0–1.2)
BUN/Creatinine Ratio: 12 (ref 9–20)
BUN: 15 mg/dL (ref 6–24)
CALCIUM: 9.2 mg/dL (ref 8.7–10.2)
CHLORIDE: 101 mmol/L (ref 96–106)
CO2: 22 mmol/L (ref 20–29)
Creatinine, Ser: 1.25 mg/dL (ref 0.76–1.27)
GFR calc Af Amer: 75 mL/min/{1.73_m2} (ref >59)
GFR, EST NON AFRICAN AMERICAN: 65 mL/min/{1.73_m2} (ref >59)
Globulin, Total: 2.1 g/dL (ref 1.5–4.5)
Glucose: 86 mg/dL (ref 65–99)
POTASSIUM: 4.4 mmol/L (ref 3.5–5.2)
Sodium: 138 mmol/L (ref 134–144)
Total Protein: 6.6 g/dL (ref 6.0–8.5)

## 2017-08-09 LAB — LIPID PANEL W/O CHOL/HDL RATIO
CHOLESTEROL TOTAL: 173 mg/dL (ref 100–199)
HDL: 48 mg/dL (ref >39)
LDL Calculated: 97 mg/dL (ref 0–99)
TRIGLYCERIDES: 140 mg/dL (ref 0–149)
VLDL Cholesterol Cal: 28 mg/dL (ref 5–40)

## 2017-08-09 LAB — THYROID PANEL WITH TSH
FREE THYROXINE INDEX: 2.4 (ref 1.2–4.9)
T3 Uptake Ratio: 30 % (ref 24–39)
T4, Total: 8 ug/dL (ref 4.5–12.0)
TSH: 1.34 u[IU]/mL (ref 0.450–4.500)

## 2017-08-11 ENCOUNTER — Encounter: Payer: Self-pay | Admitting: Unknown Physician Specialty

## 2017-09-18 MED FILL — LEVOTHYROXINE 150 MCG TAB: 150 | 90 days supply | Qty: 90 | Fill #3

## 2017-09-19 MED FILL — FENOFIBRATE 145 MG TABLET: 145 | 90 days supply | Qty: 90 | Fill #0

## 2017-10-15 MED FILL — ATORVASTATIN 10 MG TABLET: 10 | 90 days supply | Qty: 90 | Fill #0

## 2017-10-22 DIAGNOSIS — H524 Presbyopia: Secondary | ICD-10-CM | POA: Diagnosis not present

## 2017-11-14 MED FILL — ATENOLOL 100 MG TAB: 100 | 90 days supply | Qty: 90 | Fill #1

## 2017-12-26 MED FILL — LEVOTHYROXINE 150 MCG TAB: 150 | 90 days supply | Qty: 90 | Fill #0

## 2017-12-26 MED FILL — FENOFIBRATE 145 MG TABLET: 145 | 90 days supply | Qty: 90 | Fill #1

## 2018-01-19 MED FILL — ATORVASTATIN 10 MG TABLET: 10 | 90 days supply | Qty: 90 | Fill #1

## 2018-02-09 ENCOUNTER — Encounter: Payer: Self-pay | Admitting: Unknown Physician Specialty

## 2018-02-09 ENCOUNTER — Ambulatory Visit (INDEPENDENT_AMBULATORY_CARE_PROVIDER_SITE_OTHER): Payer: 59 | Admitting: Unknown Physician Specialty

## 2018-02-09 VITALS — BP 126/77 | HR 66 | Temp 98.5°F | Ht 71.3 in | Wt 216.7 lb

## 2018-02-09 DIAGNOSIS — M1A9XX0 Chronic gout, unspecified, without tophus (tophi): Secondary | ICD-10-CM | POA: Diagnosis not present

## 2018-02-09 DIAGNOSIS — Z0000 Encounter for general adult medical examination without abnormal findings: Secondary | ICD-10-CM

## 2018-02-09 DIAGNOSIS — I10 Essential (primary) hypertension: Secondary | ICD-10-CM | POA: Diagnosis not present

## 2018-02-09 DIAGNOSIS — E781 Pure hyperglyceridemia: Secondary | ICD-10-CM | POA: Diagnosis not present

## 2018-02-09 MED ORDER — ATENOLOL 100 MG PO TABS: 100.0000 mg | ORAL_TABLET | Freq: Every day | ORAL | 1 refills | Status: DC

## 2018-02-09 MED ORDER — INDOMETHACIN 50 MG PO CAPS: 50.0000 mg | ORAL_CAPSULE | Freq: Three times a day (TID) | ORAL | 2 refills | Status: DC | PRN

## 2018-02-09 MED ORDER — ATORVASTATIN CALCIUM 10 MG PO TABS: 10.0000 mg | ORAL_TABLET | Freq: Every day | ORAL | 1 refills | Status: DC

## 2018-02-09 MED ORDER — LEVOTHYROXINE SODIUM 150 MCG PO TABS: 150.0000 ug | ORAL_TABLET | Freq: Every day | ORAL | 3 refills | Status: DC

## 2018-02-09 MED FILL — ATENOLOL 100 MG TAB: 100 | 90 days supply | Qty: 90 | Fill #0

## 2018-02-09 MED FILL — INDOMETHACIN 50 MG CAPSULE: 50 | 10 days supply | Qty: 30 | Fill #0

## 2018-02-09 NOTE — Assessment & Plan Note (Signed)
Stable, continue present medications.   

## 2018-02-09 NOTE — Progress Notes (Signed)
BP 126/77   Pulse 66   Temp 98.5 F (36.9 C) (Oral)   Ht 5' 11.3" (1.811 m)   Wt 216 lb 11.2 oz (98.3 kg)   SpO2 97%   BMI 29.97 kg/m    Subjective:    Patient ID: George Hawkins, male    DOB: [birth year], 57 y.o.   MRN: [id number]  HPI: George Hawkins is a 56 y.o. male  Chief Complaint  Patient presents with  . Annual Exam   Hypertension Using medications without difficulty Average home BPs Not checking No problems or lightheadedness No chest pain with exertion or shortness of breath No Edema  Hyperlipidemia Using medications without problems: No Muscle aches  Diet compliance: "OK" Exercise:"Back at it"  Social History   Socioeconomic History  . Marital status: Married    Spouse name: Not on file  . Number of children: Not on file  . Years of education: Not on file  . Highest education level: Not on file  Social Needs  . Financial resource strain: Not on file  . Food insecurity - worry: Not on file  . Food insecurity - inability: Not on file  . Transportation needs - medical: Not on file  . Transportation needs - non-medical: Not on file  Occupational History  . Not on file  Tobacco Use  . Smoking status: Never Smoker  . Smokeless tobacco: Never Used  Substance and Sexual Activity  . Alcohol use: Yes    Alcohol/week: 8.4 oz    Types: 14 Cans of beer per week  . Drug use: No  . Sexual activity: Yes  Other Topics Concern  . Not on file  Social History Narrative  . Not on file   Family History  Problem Relation Age of Onset  . Thyroid disease Brother   . Thyroid disease Sister   . Thyroid disease Brother   . Heart attack Neg Hx   . Hyperlipidemia Neg Hx   . Hypertension Neg Hx   . Sudden death Neg Hx    Past Medical History:  Diagnosis Date  . Gout   . Hyperlipidemia   . Hypertension   . Hypothyroidism    Past Surgical History:  Procedure Laterality Date  . TENDON REPAIR Right    Hand  . VASECTOMY     Relevant past medical,  surgical, family and social history reviewed and updated as indicated. Interim medical history since our last visit reviewed. Allergies and medications reviewed and updated.  Review of Systems  Constitutional: Negative.   HENT: Negative.   Eyes: Negative.   Respiratory: Negative.   Cardiovascular: Negative.   Gastrointestinal: Negative.   Endocrine: Negative.   Genitourinary: Negative.   Skin: Negative.   Allergic/Immunologic: Negative.   Neurological: Negative.   Hematological: Negative.   Psychiatric/Behavioral: Negative.    Per HPI unless specifically indicated above     Objective:    BP 126/77   Pulse 66   Temp 98.5 F (36.9 C) (Oral)   Ht 5' 11.3" (1.811 m)   Wt 216 lb 11.2 oz (98.3 kg)   SpO2 97%   BMI 29.97 kg/m   Wt Readings from Last 3 Encounters:  02/09/18 216 lb 11.2 oz (98.3 kg)  08/08/17 215 lb 3.2 oz (97.6 kg)  02/07/17 206 lb 9.6 oz (93.7 kg)    Physical Exam  Constitutional: He is oriented to person, place, and time. He appears well-developed and well-nourished.  HENT:  Head: Normocephalic.  Right Ear: Tympanic membrane,  external ear and ear canal normal.  Left Ear: Tympanic membrane, external ear and ear canal normal.  Mouth/Throat: Uvula is midline, oropharynx is clear and moist and mucous membranes are normal.  Eyes: Pupils are equal, round, and reactive to light.  Cardiovascular: Normal rate, regular rhythm and normal heart sounds. Exam reveals no gallop and no friction rub.  No murmur heard. Pulmonary/Chest: Effort normal and breath sounds normal. No respiratory distress.  Abdominal: Soft. Bowel sounds are normal. He exhibits no distension. There is no tenderness.  Musculoskeletal: Normal range of motion.  Neurological: He is alert and oriented to person, place, and time. He has normal reflexes.  Skin: Skin is warm and dry.  Psychiatric: He has a normal mood and affect. His behavior is normal. Judgment and thought content normal.    Results  for orders placed or performed in visit on 08/08/17  Comprehensive metabolic panel  Result Value Ref Range   Glucose 86 65 - 99 mg/dL   BUN 15 6 - 24 mg/dL   Creatinine, Ser 1.25 0.76 - 1.27 mg/dL   GFR calc non Af Amer 65 >59 mL/min/1.73   GFR calc Af Amer 75 >59 mL/min/1.73   BUN/Creatinine Ratio 12 9 - 20   Sodium 138 134 - 144 mmol/L   Potassium 4.4 3.5 - 5.2 mmol/L   Chloride 101 96 - 106 mmol/L   CO2 22 20 - 29 mmol/L   Calcium 9.2 8.7 - 10.2 mg/dL   Total Protein 6.6 6.0 - 8.5 g/dL   Albumin 4.5 3.5 - 5.5 g/dL   Globulin, Total 2.1 1.5 - 4.5 g/dL   Albumin/Globulin Ratio 2.1 1.2 - 2.2   Bilirubin Total 0.7 0.0 - 1.2 mg/dL   Alkaline Phosphatase 30 (L) 39 - 117 IU/L   AST 27 0 - 40 IU/L   ALT 27 0 - 44 IU/L  Lipid Panel w/o Chol/HDL Ratio  Result Value Ref Range   Cholesterol, Total 173 100 - 199 mg/dL   Triglycerides 140 0 - 149 mg/dL   HDL 48 >39 mg/dL   VLDL Cholesterol Cal 28 5 - 40 mg/dL   LDL Calculated 97 0 - 99 mg/dL  Thyroid Panel With TSH  Result Value Ref Range   TSH 1.340 0.450 - 4.500 uIU/mL   T4, Total 8.0 4.5 - 12.0 ug/dL   T3 Uptake Ratio 30 24 - 39 %   Free Thyroxine Index 2.4 1.2 - 4.9      Assessment & Plan:   Problem List Items Addressed This Visit      Unprioritized   Hyperlipidemia    Check lipid panel      Hypertension    Stable, continue present medications.         Other Visit Diagnoses    Annual physical exam    -  Primary      Health maintenance: Flu shot refused Td due 2022 Colonoscopy: 2025  Follow up plan: Return in about 6 months (around 08/09/2018).

## 2018-02-09 NOTE — Assessment & Plan Note (Signed)
Check lipid panel  

## 2018-02-10 ENCOUNTER — Encounter: Payer: Self-pay | Admitting: Unknown Physician Specialty

## 2018-02-10 LAB — PSA: PROSTATE SPECIFIC AG, SERUM: 0.4 ng/mL (ref 0.0–4.0)

## 2018-02-10 LAB — CBC WITH DIFFERENTIAL/PLATELET
Basophils Absolute: 0.1 10*3/uL (ref 0.0–0.2)
Basos: 1 %
EOS (ABSOLUTE): 0.4 10*3/uL (ref 0.0–0.4)
EOS: 9 %
HEMATOCRIT: 41.1 % (ref 37.5–51.0)
HEMOGLOBIN: 14.1 g/dL (ref 13.0–17.7)
IMMATURE GRANULOCYTES: 0 %
Immature Grans (Abs): 0 10*3/uL (ref 0.0–0.1)
LYMPHS: 15 %
Lymphocytes Absolute: 0.8 10*3/uL (ref 0.7–3.1)
MCH: 32.3 pg (ref 26.6–33.0)
MCHC: 34.3 g/dL (ref 31.5–35.7)
MCV: 94 fL (ref 79–97)
MONOCYTES: 11 %
Monocytes Absolute: 0.6 10*3/uL (ref 0.1–0.9)
NEUTROS PCT: 64 %
Neutrophils Absolute: 3.3 10*3/uL (ref 1.4–7.0)
Platelets: 245 10*3/uL (ref 150–379)
RBC: 4.36 x10E6/uL (ref 4.14–5.80)
RDW: 12.1 % — ABNORMAL LOW (ref 12.3–15.4)
WBC: 5.2 10*3/uL (ref 3.4–10.8)

## 2018-02-10 LAB — COMPREHENSIVE METABOLIC PANEL
ALBUMIN: 4.6 g/dL (ref 3.5–5.5)
ALK PHOS: 39 IU/L (ref 39–117)
ALT: 25 IU/L (ref 0–44)
AST: 21 IU/L (ref 0–40)
Albumin/Globulin Ratio: 2.3 — ABNORMAL HIGH (ref 1.2–2.2)
BUN/Creatinine Ratio: 13 (ref 9–20)
BUN: 17 mg/dL (ref 6–24)
Bilirubin Total: 0.6 mg/dL (ref 0.0–1.2)
CO2: 22 mmol/L (ref 20–29)
CREATININE: 1.26 mg/dL (ref 0.76–1.27)
Calcium: 9.3 mg/dL (ref 8.7–10.2)
Chloride: 102 mmol/L (ref 96–106)
GFR calc Af Amer: 74 mL/min/{1.73_m2} (ref >59)
GFR calc non Af Amer: 64 mL/min/{1.73_m2} (ref >59)
GLUCOSE: 98 mg/dL (ref 65–99)
Globulin, Total: 2 g/dL (ref 1.5–4.5)
Potassium: 4.6 mmol/L (ref 3.5–5.2)
Sodium: 140 mmol/L (ref 134–144)
Total Protein: 6.6 g/dL (ref 6.0–8.5)

## 2018-02-10 LAB — LIPID PANEL W/O CHOL/HDL RATIO
Cholesterol, Total: 184 mg/dL (ref 100–199)
HDL: 49 mg/dL (ref >39)
LDL Calculated: 110 mg/dL — ABNORMAL HIGH (ref 0–99)
Triglycerides: 123 mg/dL (ref 0–149)
VLDL CHOLESTEROL CAL: 25 mg/dL (ref 5–40)

## 2018-02-10 LAB — TSH: TSH: 2.12 u[IU]/mL (ref 0.450–4.500)

## 2018-03-30 MED FILL — LEVOTHYROXINE 150 MCG TAB: 150 | 90 days supply | Qty: 90 | Fill #1

## 2018-04-22 MED FILL — ATORVASTATIN 10 MG TABLET: 10 | 90 days supply | Qty: 90 | Fill #0

## 2018-05-22 MED FILL — ATENOLOL 100 MG TAB: 100 | 90 days supply | Qty: 90 | Fill #1

## 2018-06-29 MED FILL — LEVOTHYROXINE 150 MCG TAB: 150 | 90 days supply | Qty: 90 | Fill #2

## 2018-07-10 ENCOUNTER — Encounter: Payer: Self-pay | Admitting: Unknown Physician Specialty

## 2018-07-23 MED FILL — ATORVASTATIN 10 MG TABLET: 10 | 90 days supply | Qty: 90 | Fill #1

## 2018-08-10 ENCOUNTER — Ambulatory Visit: Payer: 59 | Admitting: Unknown Physician Specialty

## 2018-08-13 ENCOUNTER — Encounter: Payer: Self-pay | Admitting: Physician Assistant

## 2018-08-13 ENCOUNTER — Ambulatory Visit (INDEPENDENT_AMBULATORY_CARE_PROVIDER_SITE_OTHER): Payer: 59 | Admitting: Physician Assistant

## 2018-08-13 VITALS — BP 135/78 | HR 69 | Temp 98.1°F | Ht 71.0 in | Wt 207.2 lb

## 2018-08-13 DIAGNOSIS — E781 Pure hyperglyceridemia: Secondary | ICD-10-CM | POA: Diagnosis not present

## 2018-08-13 DIAGNOSIS — E039 Hypothyroidism, unspecified: Secondary | ICD-10-CM | POA: Diagnosis not present

## 2018-08-13 DIAGNOSIS — I10 Essential (primary) hypertension: Secondary | ICD-10-CM

## 2018-08-13 MED ORDER — ATORVASTATIN CALCIUM 10 MG PO TABS: 10.0000 mg | ORAL_TABLET | Freq: Every day | ORAL | 0 refills | Status: DC

## 2018-08-13 MED ORDER — ATENOLOL 100 MG PO TABS: 100.0000 mg | ORAL_TABLET | Freq: Every day | ORAL | 0 refills | Status: DC

## 2018-08-13 MED ORDER — LEVOTHYROXINE SODIUM 150 MCG PO TABS: 150.0000 ug | ORAL_TABLET | Freq: Every day | ORAL | 0 refills | Status: DC

## 2018-08-13 MED FILL — ATENOLOL 100 MG TAB: 100 | 90 days supply | Qty: 90 | Fill #0

## 2018-08-13 NOTE — Patient Instructions (Signed)

## 2018-08-13 NOTE — Progress Notes (Signed)
 Subjective:    Patient ID: George Hawkins, male    DOB: 12/31/1960, 57 y.o.   MRN: [id number]  George Hawkins is a 57 y.o. male presenting on 08/13/2018 for Hyperlipidemia and Hypertension   HPI   HLD: Taking Lipitor 10 mg doing well without complaints.  Lipid Panel     Component Value Date/Time   CHOL 184 02/09/2018 1044   CHOL 167 02/07/2017 1457   TRIG 123 02/09/2018 1044   TRIG 129 02/07/2017 1457   HDL 49 02/09/2018 1044   VLDL 26 02/07/2017 1457   LDLCALC 110 (H) 02/09/2018 1044     HTN: Taking atenolol 100 mg daily. No chest pain, SOB, edema.   BP Readings from Last 3 Encounters:  08/13/18 135/78  02/09/18 126/77  08/08/17 129/77   Hypothyroidism: Currently taking 150 mcg. Denies constipation, edema, hair loss, cold intolerance.  Social History   Tobacco Use  . Smoking status: Never Smoker  . Smokeless tobacco: Never Used  Substance Use Topics  . Alcohol use: Yes    Alcohol/week: 14.0 standard drinks    Types: 14 Cans of beer per week  . Drug use: No    Review of Systems Per HPI unless specifically indicated above     Objective:    BP 135/78   Pulse 69   Temp 98.1 F (36.7 C) (Oral)   Ht 5' 11" (1.803 m)   Wt 207 lb 3.2 oz (94 kg)   SpO2 97%   BMI 28.90 kg/m   Wt Readings from Last 3 Encounters:  08/13/18 207 lb 3.2 oz (94 kg)  02/09/18 216 lb 11.2 oz (98.3 kg)  08/08/17 215 lb 3.2 oz (97.6 kg)    Physical Exam  Constitutional: He is oriented to person, place, and time. He appears well-developed and well-nourished.  Cardiovascular: Normal rate and regular rhythm.  Pulmonary/Chest: Effort normal and breath sounds normal.  Neurological: He is alert and oriented to person, place, and time.  Skin: Skin is warm and dry.  Psychiatric: He has a normal mood and affect. His behavior is normal.   Results for orders placed or performed in visit on 02/09/18  Lipid Panel w/o Chol/HDL Ratio  Result Value Ref Range   Cholesterol, Total 184  100 - 199 mg/dL   Triglycerides 123 0 - 149 mg/dL   HDL 49 >39 mg/dL   VLDL Cholesterol Cal 25 5 - 40 mg/dL   LDL Calculated 110 (H) 0 - 99 mg/dL  Comprehensive metabolic panel  Result Value Ref Range   Glucose 98 65 - 99 mg/dL   BUN 17 6 - 24 mg/dL   Creatinine, Ser 1.26 0.76 - 1.27 mg/dL   GFR calc non Af Amer 64 >59 mL/min/1.73   GFR calc Af Amer 74 >59 mL/min/1.73   BUN/Creatinine Ratio 13 9 - 20   Sodium 140 134 - 144 mmol/L   Potassium 4.6 3.5 - 5.2 mmol/L   Chloride 102 96 - 106 mmol/L   CO2 22 20 - 29 mmol/L   Calcium 9.3 8.7 - 10.2 mg/dL   Total Protein 6.6 6.0 - 8.5 g/dL   Albumin 4.6 3.5 - 5.5 g/dL   Globulin, Total 2.0 1.5 - 4.5 g/dL   Albumin/Globulin Ratio 2.3 (H) 1.2 - 2.2   Bilirubin Total 0.6 0.0 - 1.2 mg/dL   Alkaline Phosphatase 39 39 - 117 IU/L   AST 21 0 - 40 IU/L   ALT 25 0 - 44 IU/L  CBC with Differential/Platelet    Result Value Ref Range   WBC 5.2 3.4 - 10.8 x10E3/uL   RBC 4.36 4.14 - 5.80 x10E6/uL   Hemoglobin 14.1 13.0 - 17.7 g/dL   Hematocrit 41.1 37.5 - 51.0 %   MCV 94 79 - 97 fL   MCH 32.3 26.6 - 33.0 pg   MCHC 34.3 31.5 - 35.7 g/dL   RDW 12.1 (L) 12.3 - 15.4 %   Platelets 245 150 - 379 x10E3/uL   Neutrophils 64 Not Estab. %   Lymphs 15 Not Estab. %   Monocytes 11 Not Estab. %   Eos 9 Not Estab. %   Basos 1 Not Estab. %   Neutrophils Absolute 3.3 1.4 - 7.0 x10E3/uL   Lymphocytes Absolute 0.8 0.7 - 3.1 x10E3/uL   Monocytes Absolute 0.6 0.1 - 0.9 x10E3/uL   EOS (ABSOLUTE) 0.4 0.0 - 0.4 x10E3/uL   Basophils Absolute 0.1 0.0 - 0.2 x10E3/uL   Immature Granulocytes 0 Not Estab. %   Immature Grans (Abs) 0.0 0.0 - 0.1 x10E3/uL  TSH  Result Value Ref Range   TSH 2.120 0.450 - 4.500 uIU/mL  PSA  Result Value Ref Range   Prostate Specific Ag, Serum 0.4 0.0 - 4.0 ng/mL      Assessment & Plan:  1. Pure hyperglyceridemia   - Lipid Profile - atorvastatin (LIPITOR) 10 MG tablet; Take 1 tablet (10 mg total) by mouth daily.  Dispense: 90 tablet;  Refill: 0  2. Adult hypothyroidism  - TSH - levothyroxine (SYNTHROID, LEVOTHROID) 150 MCG tablet; Take 1 tablet (150 mcg total) by mouth daily before breakfast.  Dispense: 90 tablet; Refill: 0  3. Essential hypertension  - Comp Met (CMET) - atenolol (TENORMIN) 100 MG tablet; Take 1 tablet (100 mg total) by mouth daily.  Dispense: 90 tablet; Refill: 0    Follow up plan: Return in about 6 months (around 02/13/2019) for CPE/f/u .  Carles Collet, PA-C [facility] 08/13/2018, 10:34 AM

## 2018-08-14 LAB — COMPREHENSIVE METABOLIC PANEL
ALT: 25 IU/L (ref 0–44)
AST: 19 IU/L (ref 0–40)
Albumin/Globulin Ratio: 2.3 — ABNORMAL HIGH (ref 1.2–2.2)
Albumin: 4.5 g/dL (ref 3.5–5.5)
Alkaline Phosphatase: 84 IU/L (ref 39–117)
BUN/Creatinine Ratio: 16 (ref 9–20)
BUN: 14 mg/dL (ref 6–24)
Bilirubin Total: 0.5 mg/dL (ref 0.0–1.2)
CO2: 19 mmol/L — ABNORMAL LOW (ref 20–29)
Calcium: 9.1 mg/dL (ref 8.7–10.2)
Chloride: 106 mmol/L (ref 96–106)
Creatinine, Ser: 0.85 mg/dL (ref 0.76–1.27)
GFR calc Af Amer: 113 mL/min/{1.73_m2} (ref >59)
GFR calc non Af Amer: 98 mL/min/{1.73_m2} (ref >59)
Globulin, Total: 2 g/dL (ref 1.5–4.5)
Glucose: 100 mg/dL — ABNORMAL HIGH (ref 65–99)
Potassium: 4.5 mmol/L (ref 3.5–5.2)
Sodium: 141 mmol/L (ref 134–144)
Total Protein: 6.5 g/dL (ref 6.0–8.5)

## 2018-08-14 LAB — LIPID PANEL
Chol/HDL Ratio: 3.8 ratio (ref 0.0–5.0)
Cholesterol, Total: 163 mg/dL (ref 100–199)
HDL: 43 mg/dL (ref >39)
LDL Calculated: 85 mg/dL (ref 0–99)
Triglycerides: 175 mg/dL — ABNORMAL HIGH (ref 0–149)
VLDL Cholesterol Cal: 35 mg/dL (ref 5–40)

## 2018-08-14 LAB — TSH: TSH: 1.9 u[IU]/mL (ref 0.450–4.500)

## 2018-09-28 MED FILL — LEVOTHYROXINE 150 MCG TAB: 150 | 90 days supply | Qty: 90 | Fill #0

## 2018-10-12 DIAGNOSIS — H524 Presbyopia: Secondary | ICD-10-CM | POA: Diagnosis not present

## 2018-10-22 MED FILL — ATORVASTATIN 10 MG TABLET: 10 | 90 days supply | Qty: 90 | Fill #0

## 2018-11-17 ENCOUNTER — Other Ambulatory Visit: Payer: Self-pay | Admitting: Physician Assistant

## 2018-11-17 DIAGNOSIS — I10 Essential (primary) hypertension: Secondary | ICD-10-CM

## 2018-11-17 MED FILL — ATENOLOL 100 MG TAB: 100 | 90 days supply | Qty: 90 | Fill #0

## 2018-12-29 MED FILL — LEVOTHYROXINE 150 MCG TAB: 150 | 90 days supply | Qty: 90 | Fill #0

## 2019-01-25 ENCOUNTER — Other Ambulatory Visit: Payer: Self-pay | Admitting: Physician Assistant

## 2019-01-25 DIAGNOSIS — E781 Pure hyperglyceridemia: Secondary | ICD-10-CM

## 2019-01-25 MED FILL — ATORVASTATIN 10 MG TABLET: 10 | 90 days supply | Qty: 90 | Fill #0

## 2019-02-16 ENCOUNTER — Encounter: Payer: 59 | Admitting: Nurse Practitioner

## 2019-02-18 ENCOUNTER — Encounter: Payer: Self-pay | Admitting: Nurse Practitioner

## 2019-02-18 ENCOUNTER — Ambulatory Visit (INDEPENDENT_AMBULATORY_CARE_PROVIDER_SITE_OTHER): Payer: 59 | Admitting: Nurse Practitioner

## 2019-02-18 VITALS — BP 137/80 | HR 62 | Temp 98.1°F | Ht 70.47 in | Wt 212.0 lb

## 2019-02-18 DIAGNOSIS — Z0000 Encounter for general adult medical examination without abnormal findings: Secondary | ICD-10-CM | POA: Diagnosis not present

## 2019-02-18 DIAGNOSIS — E669 Obesity, unspecified: Secondary | ICD-10-CM | POA: Insufficient documentation

## 2019-02-18 DIAGNOSIS — Z6830 Body mass index (BMI) 30.0-30.9, adult: Secondary | ICD-10-CM

## 2019-02-18 DIAGNOSIS — E781 Pure hyperglyceridemia: Secondary | ICD-10-CM

## 2019-02-18 DIAGNOSIS — E6609 Other obesity due to excess calories: Secondary | ICD-10-CM

## 2019-02-18 DIAGNOSIS — E039 Hypothyroidism, unspecified: Secondary | ICD-10-CM

## 2019-02-18 DIAGNOSIS — I10 Essential (primary) hypertension: Secondary | ICD-10-CM

## 2019-02-18 MED ORDER — ATENOLOL 100 MG PO TABS: 100.0000 mg | ORAL_TABLET | Freq: Every day | ORAL | 3 refills | Status: DC

## 2019-02-18 MED ORDER — ATORVASTATIN CALCIUM 10 MG PO TABS: 10.0000 mg | ORAL_TABLET | Freq: Every day | ORAL | 3 refills | Status: DC

## 2019-02-18 MED ORDER — LEVOTHYROXINE SODIUM 150 MCG PO TABS: 150.0000 ug | ORAL_TABLET | Freq: Every day | ORAL | 3 refills | Status: DC

## 2019-02-18 MED FILL — ATENOLOL 100 MG TAB: 100 | 90 days supply | Qty: 90 | Fill #0

## 2019-02-18 NOTE — Assessment & Plan Note (Addendum)
Chronic, ongoing.  BP at goal.  Continue current medication regimen.  CMP and CBC today.

## 2019-02-18 NOTE — Assessment & Plan Note (Signed)
Chronic, stable.  Check TSH today and adjust Levothyroxine dose as needed.

## 2019-02-18 NOTE — Assessment & Plan Note (Signed)
Recommend focus on healthy diet choices and regular physical activity (30 minutes a day 5 days a week).

## 2019-02-18 NOTE — Patient Instructions (Signed)
Zoster Vaccine, Recombinant injection  What is this medicine?  ZOSTER VACCINE (ZOS ter vak SEEN) is used to prevent shingles in adults 56 years old and over. This vaccine is not used to treat shingles or nerve pain from shingles.  This medicine may be used for other purposes; ask your health care provider or pharmacist if you have questions.  COMMON BRAND NAME(S): SHINGRIX  What should I tell my health care provider before I take this medicine?  They need to know if you have any of these conditions:  -blood disorders or disease  -cancer like leukemia or lymphoma  -immune system problems or therapy  -an unusual or allergic reaction to vaccines, other medications, foods, dyes, or preservatives  -pregnant or trying to get pregnant  -breast-feeding  How should I use this medicine?  This vaccine is for injection in a muscle. It is given by a health care professional.  Talk to your pediatrician regarding the use of this medicine in children. This medicine is not approved for use in children.  Overdosage: If you think you have taken too much of this medicine contact a poison control center or emergency room at once.  NOTE: This medicine is only for you. Do not share this medicine with others.  What if I miss a dose?  Keep appointments for follow-up (booster) doses as directed. It is important not to miss your dose. Call your doctor or health care professional if you are unable to keep an appointment.  What may interact with this medicine?  -medicines that suppress your immune system  -medicines to treat cancer  -steroid medicines like prednisone or cortisone  This list may not describe all possible interactions. Give your health care provider a list of all the medicines, herbs, non-prescription drugs, or dietary supplements you use. Also tell them if you smoke, drink alcohol, or use illegal drugs. Some items may interact with your medicine.  What should I watch for while using this medicine?  Visit your doctor for regular  check ups.  This vaccine, like all vaccines, may not fully protect everyone.  What side effects may I notice from receiving this medicine?  Side effects that you should report to your doctor or health care professional as soon as possible:  -allergic reactions like skin rash, itching or hives, swelling of the face, lips, or tongue  -breathing problems  Side effects that usually do not require medical attention (report these to your doctor or health care professional if they continue or are bothersome):  -chills  -headache  -fever  -nausea, vomiting  -redness, warmth, pain, swelling or itching at site where injected  -tiredness  This list may not describe all possible side effects. Call your doctor for medical advice about side effects. You may report side effects to FDA at 1-800-FDA-1088.  Where should I keep my medicine?  This vaccine is only given in a clinic, pharmacy, doctor's office, or other health care setting and will not be stored at home.  NOTE: This sheet is a summary. It may not cover all possible information. If you have questions about this medicine, talk to your doctor, pharmacist, or health care provider.   2019 [employer]vier/Gold Standard (2017-07-21 13:20:30)

## 2019-02-18 NOTE — Assessment & Plan Note (Signed)
Chronic, ongoing.  Continue current medication regimen.  Lipid panel today. 

## 2019-02-18 NOTE — Progress Notes (Signed)
BP 137/80   Pulse 62   Temp 98.1 F (36.7 C) (Oral)   Ht 5' 10.47" (1.79 m)   Wt 212 lb (96.2 kg)   SpO2 98%   BMI 30.01 kg/m    Subjective:    Patient ID: George Hawkins, male    DOB: Mar 15, 1961, 58 y.o.   MRN: [id number]  HPI: George Hawkins is a 58 y.o. male presenting on 02/18/2019 for comprehensive medical examination. Current medical complaints include:none  He currently lives with: wife Interim Problems from his last visit: no   HYPERTENSION / HYPERLIPIDEMIA Currently on Atenolol daily and Lipitor. Satisfied with current treatment? yes Duration of hypertension: chronic BP monitoring frequency: not checking BP range:  BP medication side effects: no Duration of hyperlipidemia: chronic Cholesterol medication side effects: no Cholesterol supplements: fish oil Medication compliance: good compliance Aspirin: yes Recent stressors: no Recurrent headaches: no Visual changes: no Palpitations: no Dyspnea: no Chest pain: no Lower extremity edema: no Dizzy/lightheaded: no   HYPOTHYROIDISM Currently taking Levothyroxine 150 MCG daily with last TSH August 1.900.  Is taking medication with other medications and food, have recommended to take 30 minutes before other medications and meal. Thyroid control status:stable Satisfied with current treatment? yes Medication side effects: no Medication compliance: good compliance Etiology of hypothyroidism:  Recent dose adjustment:no Fatigue: no Cold intolerance: no Heat intolerance: no Weight gain: no Weight loss: no Constipation: no Diarrhea/loose stools: no Palpitations: no Lower extremity edema: no Anxiety/depressed mood: no  Functional Status Survey: Is the patient deaf or have difficulty hearing?: No Does the patient have difficulty seeing, even when wearing glasses/contacts?: No Does the patient have difficulty concentrating, remembering, or making decisions?: No Does the patient have difficulty walking or  climbing stairs?: No Does the patient have difficulty dressing or bathing?: No Does the patient have difficulty doing errands alone such as visiting a doctor's office or shopping?: No  FALL RISK: Fall Risk  02/18/2019 02/09/2018 11/04/2016  Falls in the past year? 0 No No  Follow up Falls evaluation completed - -    Depression Screen Depression screen [facility] 2/9 02/18/2019 02/09/2018 11/04/2016 10/30/2015  Decreased Interest 0 0 0 0  Down, Depressed, Hopeless 0 0 0 0  PHQ - 2 Score 0 0 0 0  Altered sleeping 0 0 - -  Tired, decreased energy 0 0 - -  Change in appetite 0 0 - -  Feeling bad or failure about yourself  0 0 - -  Trouble concentrating 0 0 - -  Moving slowly or fidgety/restless 0 0 - -  Suicidal thoughts 0 0 - -  PHQ-9 Score 0 0 - -  Difficult doing work/chores Not difficult at all - - -    Past Medical History:  Past Medical History:  Diagnosis Date  . Gout   . Hyperlipidemia   . Hypertension   . Hypothyroidism     Surgical History:  Past Surgical History:  Procedure Laterality Date  . TENDON REPAIR Right    Hand  . VASECTOMY      Medications:  Current Outpatient Medications on File Prior to Visit  Medication Sig  . aspirin 81 MG chewable tablet Chew 81 mg by mouth daily.  . Fish Oil OIL by Does not apply route.  . indomethacin (INDOCIN) 50 MG capsule Take 1 capsule (50 mg total) by mouth 3 (three) times daily as needed.   No current facility-administered medications on file prior to visit.     Allergies:  No  Known Allergies  Social History:  Social History   Socioeconomic History  . Marital status: Married    Spouse name: Not on file  . Number of children: Not on file  . Years of education: Not on file  . Highest education level: Not on file  Occupational History  . Not on file  Social Needs  . Financial resource strain: Not on file  . Food insecurity:    Worry: Not on file    Inability: Not on file  . Transportation needs:    Medical: Not  on file    Non-medical: Not on file  Tobacco Use  . Smoking status: Never Smoker  . Smokeless tobacco: Never Used  Substance and Sexual Activity  . Alcohol use: Yes    Alcohol/week: 14.0 standard drinks    Types: 14 Cans of beer per week  . Drug use: No  . Sexual activity: Yes  Lifestyle  . Physical activity:    Days per week: Not on file    Minutes per session: Not on file  . Stress: Not on file  Relationships  . Social connections:    Talks on phone: Not on file    Gets together: Not on file    Attends religious service: Not on file    Active member of club or organization: Not on file    Attends meetings of clubs or organizations: Not on file    Relationship status: Not on file  . Intimate partner violence:    Fear of current or ex partner: Not on file    Emotionally abused: Not on file    Physically abused: Not on file    Forced sexual activity: Not on file  Other Topics Concern  . Not on file  Social History Narrative  . Not on file   Social History   Tobacco Use  Smoking Status Never Smoker  Smokeless Tobacco Never Used   Social History   Substance and Sexual Activity  Alcohol Use Yes  . Alcohol/week: 14.0 standard drinks  . Types: 14 Cans of beer per week    Family History:  Family History  Problem Relation Age of Onset  . Thyroid disease Brother   . Thyroid disease Sister   . Thyroid disease Brother   . Heart attack Neg Hx   . Hyperlipidemia Neg Hx   . Hypertension Neg Hx   . Sudden death Neg Hx     Past medical history, surgical history, medications, allergies, family history and social history reviewed with patient today and changes made to appropriate areas of the chart.   Review of Systems - Negative All other ROS negative except what is listed above and in the HPI.      Objective:    BP 137/80   Pulse 62   Temp 98.1 F (36.7 C) (Oral)   Ht 5' 10.47" (1.79 m)   Wt 212 lb (96.2 kg)   SpO2 98%   BMI 30.01 kg/m   Wt Readings from  Last 3 Encounters:  02/18/19 212 lb (96.2 kg)  08/13/18 207 lb 3.2 oz (94 kg)  02/09/18 216 lb 11.2 oz (98.3 kg)    Physical Exam Vitals signs and nursing note reviewed.  Constitutional:      General: He is awake.     Appearance: He is well-developed.  HENT:     Head: Normocephalic and atraumatic.     Right Ear: Hearing, tympanic membrane, ear canal and external ear normal. No decreased hearing noted.  No drainage.     Left Ear: Hearing, tympanic membrane, ear canal and external ear normal. No decreased hearing noted. No drainage.     Nose: Nose normal.     Right Sinus: No maxillary sinus tenderness or frontal sinus tenderness.     Left Sinus: No maxillary sinus tenderness or frontal sinus tenderness.     Mouth/Throat:     Pharynx: Uvula midline.  Eyes:     General: Lids are normal.        Right eye: No discharge.        Left eye: No discharge.     Conjunctiva/sclera: Conjunctivae normal.     Pupils: Pupils are equal, round, and reactive to light.     Visual Fields: Right eye visual fields normal and left eye visual fields normal.  Neck:     Musculoskeletal: Normal range of motion and neck supple.     Thyroid: No thyromegaly.     Vascular: No carotid bruit or JVD.     Trachea: Trachea normal.  Cardiovascular:     Rate and Rhythm: Normal rate and regular rhythm.     Heart sounds: Normal heart sounds, S1 normal and S2 normal. No murmur. No gallop.   Pulmonary:     Effort: Pulmonary effort is normal.     Breath sounds: Normal breath sounds.  Abdominal:     General: Bowel sounds are normal.     Palpations: Abdomen is soft. There is no hepatomegaly or splenomegaly.  Genitourinary:    Penis: Normal.      Rectum: Normal.  Musculoskeletal: Normal range of motion.     Right lower leg: No edema.     Left lower leg: No edema.  Lymphadenopathy:     Cervical: No cervical adenopathy.  Skin:    General: Skin is warm and dry.     Capillary Refill: Capillary refill takes less than 2  seconds.     Findings: No rash.  Neurological:     Mental Status: He is alert and oriented to person, place, and time.     Cranial Nerves: Cranial nerves are intact.     Sensory: Sensation is intact.     Motor: Motor function is intact.     Coordination: Coordination is intact.     Gait: Gait is intact.     Deep Tendon Reflexes: Reflexes are normal and symmetric.     Reflex Scores:      Brachioradialis reflexes are 2+ on the right side and 2+ on the left side.      Patellar reflexes are 2+ on the right side and 2+ on the left side. Psychiatric:        Attention and Perception: Attention normal.        Mood and Affect: Mood normal.        Speech: Speech normal.        Behavior: Behavior normal. Behavior is cooperative.        Thought Content: Thought content normal.        Cognition and Memory: Cognition normal.        Judgment: Judgment normal.     Results for orders placed or performed in visit on 08/13/18  Comp Met (CMET)  Result Value Ref Range   Glucose 100 (H) 65 - 99 mg/dL   BUN 14 6 - 24 mg/dL   Creatinine, Ser 0.85 0.76 - 1.27 mg/dL   GFR calc non Af Amer 98 >59 mL/min/1.73   GFR calc Af Amer 113 >  59 mL/min/1.73   BUN/Creatinine Ratio 16 9 - 20   Sodium 141 134 - 144 mmol/L   Potassium 4.5 3.5 - 5.2 mmol/L   Chloride 106 96 - 106 mmol/L   CO2 19 (L) 20 - 29 mmol/L   Calcium 9.1 8.7 - 10.2 mg/dL   Total Protein 6.5 6.0 - 8.5 g/dL   Albumin 4.5 3.5 - 5.5 g/dL   Globulin, Total 2.0 1.5 - 4.5 g/dL   Albumin/Globulin Ratio 2.3 (H) 1.2 - 2.2   Bilirubin Total 0.5 0.0 - 1.2 mg/dL   Alkaline Phosphatase 84 39 - 117 IU/L   AST 19 0 - 40 IU/L   ALT 25 0 - 44 IU/L  TSH  Result Value Ref Range   TSH 1.900 0.450 - 4.500 uIU/mL  Lipid Profile  Result Value Ref Range   Cholesterol, Total 163 100 - 199 mg/dL   Triglycerides 175 (H) 0 - 149 mg/dL   HDL 43 >39 mg/dL   VLDL Cholesterol Cal 35 5 - 40 mg/dL   LDL Calculated 85 0 - 99 mg/dL   Chol/HDL Ratio 3.8 0.0 - 5.0  ratio      Assessment & Plan:   Problem List Items Addressed This Visit      Cardiovascular and Mediastinum   Hypertension    Chronic, ongoing.  BP at goal.  Continue current medication regimen.  CMP and CBC today.      Relevant Medications   atorvastatin (LIPITOR) 10 MG tablet   atenolol (TENORMIN) 100 MG tablet   Other Relevant Orders   CBC with Differential/Platelet   Comprehensive metabolic panel     Endocrine   Hypothyroidism    Chronic, stable.  Check TSH today and adjust Levothyroxine dose as needed.      Relevant Medications   levothyroxine (SYNTHROID, LEVOTHROID) 150 MCG tablet   atenolol (TENORMIN) 100 MG tablet   Other Relevant Orders   Thyroid Panel With TSH     Other   Hyperlipidemia    Chronic, ongoing.  Continue current medication regimen.  Lipid panel today.      Relevant Medications   atorvastatin (LIPITOR) 10 MG tablet   atenolol (TENORMIN) 100 MG tablet   Other Relevant Orders   Lipid Panel w/o Chol/HDL Ratio   Obesity    Recommend focus on healthy diet choices and regular physical activity (30 minutes a day 5 days a week).       Other Visit Diagnoses    Annual physical exam    -  Primary   Relevant Orders   PSA      Discussed aspirin prophylaxis for myocardial infarction prevention and decision was made to continue ASA  LABORATORY TESTING:  Health maintenance labs ordered today as discussed above.   The natural history of prostate cancer and ongoing controversy regarding screening and potential treatment outcomes of prostate cancer has been discussed with the patient. The meaning of a false positive PSA and a false negative PSA has been discussed. He indicates understanding of the limitations of this screening test and wishes to proceed with screening PSA testing.   IMMUNIZATIONS:   - Tdap: Tetanus vaccination status reviewed: up to date - Influenza: Refused - Pneumovax: Not applicable - Prevnar: Not applicable - Zostavax vaccine:  Refused  SCREENING: - Colonoscopy: Up to date  Discussed with patient purpose of the colonoscopy is to detect colon cancer at curable precancerous or early stages   - AAA Screening: Not applicable  -Hearing Test: Not applicable  -  Spirometry: Not applicable   PATIENT COUNSELING:    Sexuality: Discussed sexually transmitted diseases, partner selection, use of condoms, avoidance of unintended pregnancy  and contraceptive alternatives.   Advised to avoid cigarette smoking.  I discussed with the patient that most people either abstain from alcohol or drink within safe limits (<=14/week and <=4 drinks/occasion for males, <=7/weeks and <= 3 drinks/occasion for females) and that the risk for alcohol disorders and other health effects rises proportionally with the number of drinks per week and how often a drinker exceeds daily limits.  Discussed cessation/primary prevention of drug use and availability of treatment for abuse.   Diet: Encouraged to adjust caloric intake to maintain  or achieve ideal body weight, to reduce intake of dietary saturated fat and total fat, to limit sodium intake by avoiding high sodium foods and not adding table salt, and to maintain adequate dietary potassium and calcium preferably from fresh fruits, vegetables, and low-fat dairy products.    stressed the importance of regular exercise  Injury prevention: Discussed safety belts, safety helmets, smoke detector, smoking near bedding or upholstery.   Dental health: Discussed importance of regular tooth brushing, flossing, and dental visits.   Follow up plan: NEXT PREVENTATIVE PHYSICAL DUE IN 1 YEAR. Return in about 6 months (around 08/19/2019) for HTN/HLD and Hypothyroid.

## 2019-02-19 LAB — COMPREHENSIVE METABOLIC PANEL
ALK PHOS: 89 IU/L (ref 39–117)
ALT: 29 IU/L (ref 0–44)
AST: 23 IU/L (ref 0–40)
Albumin/Globulin Ratio: 2.3 — ABNORMAL HIGH (ref 1.2–2.2)
Albumin: 4.5 g/dL (ref 3.8–4.9)
BILIRUBIN TOTAL: 0.7 mg/dL (ref 0.0–1.2)
BUN/Creatinine Ratio: 14 (ref 9–20)
BUN: 14 mg/dL (ref 6–24)
CO2: 23 mmol/L (ref 20–29)
CREATININE: 1.03 mg/dL (ref 0.76–1.27)
Calcium: 9.2 mg/dL (ref 8.7–10.2)
Chloride: 103 mmol/L (ref 96–106)
GFR calc Af Amer: 94 mL/min/{1.73_m2} (ref >59)
GFR calc non Af Amer: 81 mL/min/{1.73_m2} (ref >59)
Globulin, Total: 2 g/dL (ref 1.5–4.5)
Glucose: 106 mg/dL — ABNORMAL HIGH (ref 65–99)
Potassium: 4.7 mmol/L (ref 3.5–5.2)
Sodium: 139 mmol/L (ref 134–144)
Total Protein: 6.5 g/dL (ref 6.0–8.5)

## 2019-02-19 LAB — CBC WITH DIFFERENTIAL/PLATELET
BASOS: 1 %
Basophils Absolute: 0.1 10*3/uL (ref 0.0–0.2)
EOS (ABSOLUTE): 0.3 10*3/uL (ref 0.0–0.4)
EOS: 5 %
HEMOGLOBIN: 14.3 g/dL (ref 13.0–17.7)
Hematocrit: 41.4 % (ref 37.5–51.0)
IMMATURE GRANS (ABS): 0 10*3/uL (ref 0.0–0.1)
IMMATURE GRANULOCYTES: 1 %
LYMPHS: 18 %
Lymphocytes Absolute: 1 10*3/uL (ref 0.7–3.1)
MCH: 33 pg (ref 26.6–33.0)
MCHC: 34.5 g/dL (ref 31.5–35.7)
MCV: 96 fL (ref 79–97)
MONOCYTES: 8 %
Monocytes Absolute: 0.5 10*3/uL (ref 0.1–0.9)
NEUTROS ABS: 3.9 10*3/uL (ref 1.4–7.0)
NEUTROS PCT: 67 %
PLATELETS: 174 10*3/uL (ref 150–450)
RBC: 4.33 x10E6/uL (ref 4.14–5.80)
RDW: 12 % (ref 11.6–15.4)
WBC: 5.8 10*3/uL (ref 3.4–10.8)

## 2019-02-19 LAB — THYROID PANEL WITH TSH
Free Thyroxine Index: 2.2 (ref 1.2–4.9)
T3 Uptake Ratio: 28 % (ref 24–39)
T4, Total: 7.7 ug/dL (ref 4.5–12.0)
TSH: 2.88 u[IU]/mL (ref 0.450–4.500)

## 2019-02-19 LAB — LIPID PANEL W/O CHOL/HDL RATIO
CHOLESTEROL TOTAL: 173 mg/dL (ref 100–199)
HDL: 45 mg/dL (ref >39)
LDL Calculated: 92 mg/dL (ref 0–99)
Triglycerides: 180 mg/dL — ABNORMAL HIGH (ref 0–149)
VLDL Cholesterol Cal: 36 mg/dL (ref 5–40)

## 2019-02-19 LAB — PSA: Prostate Specific Ag, Serum: 0.3 ng/mL (ref 0.0–4.0)

## 2019-03-26 MED FILL — LEVOTHYROXINE 150 MCG TAB: 150 | 90 days supply | Qty: 90 | Fill #0

## 2019-03-26 MED FILL — ATORVASTATIN 10 MG TABLET: 10 | 90 days supply | Qty: 90 | Fill #0

## 2019-05-21 MED FILL — ATENOLOL 100 MG TAB: 100 | 90 days supply | Qty: 90 | Fill #1

## 2019-07-05 MED FILL — ATORVASTATIN 10 MG TABLET: 10 | 90 days supply | Qty: 90 | Fill #0

## 2019-07-05 MED FILL — LEVOTHYROXINE 150 MCG TAB: 150 | 90 days supply | Qty: 90 | Fill #0

## 2019-08-19 ENCOUNTER — Encounter: Payer: Self-pay | Admitting: Nurse Practitioner

## 2019-08-19 ENCOUNTER — Other Ambulatory Visit: Payer: Self-pay

## 2019-08-19 ENCOUNTER — Ambulatory Visit (INDEPENDENT_AMBULATORY_CARE_PROVIDER_SITE_OTHER): Payer: 59 | Admitting: Nurse Practitioner

## 2019-08-19 DIAGNOSIS — I10 Essential (primary) hypertension: Secondary | ICD-10-CM | POA: Diagnosis not present

## 2019-08-19 DIAGNOSIS — E039 Hypothyroidism, unspecified: Secondary | ICD-10-CM | POA: Diagnosis not present

## 2019-08-19 DIAGNOSIS — E781 Pure hyperglyceridemia: Secondary | ICD-10-CM

## 2019-08-19 NOTE — Progress Notes (Signed)
There were no vitals taken for this visit.   Subjective:    Patient ID: George Hawkins, male    DOB: 16-Aug-1961, 58 y.o.   MRN: [id number]  HPI: George Hawkins is a 58 y.o. male  Chief Complaint  Patient presents with  . Hyperlipidemia  . Hypertension  . Hypothyroidism    . This visit was completed via WebEx due to the restrictions of the COVID-19 pandemic. All issues as above were discussed and addressed. Physical exam was done as above through visual confirmation on WebEx. If it was felt that the patient should be evaluated in the office, they were directed there. The patient verbally consented to this visit. . Location of the patient: home . Location of the provider: home . Those involved with this call:  . Provider: Marnee Guarneri, DNP . CMA: Yvonna Alanis, CMA . Front Desk/Registration: Jill Side  . Time spent on call: 15 minutes with patient face to face via video conference. More than 50% of this time was spent in counseling and coordination of care. 10 minutes total spent in review of patient's record and preparation of their chart.  . I verified patient identity using two factors (patient name and date of birth). Patient consents verbally to being seen via telemedicine visit today.    HYPERTENSION / HYPERLIPIDEMIA Continues on Atenolol 10 MG daily, ASA, Atorvastatin, and Fish Oil daily. Satisfied with current treatment? yes Duration of hypertension: chronic BP monitoring frequency: not checking BP range:  BP medication side effects: no Duration of hyperlipidemia: chronic Cholesterol medication side effects: no Cholesterol supplements: fish oil Medication compliance: good compliance Aspirin: yes Recent stressors: no Recurrent headaches: no Visual changes: no Palpitations: no Dyspnea: no Chest pain: no Lower extremity edema: no Dizzy/lightheaded: no   HYPOTHYROIDISM Continues on Levothyroxine 150 MCG with last TSH 2.880. Thyroid control  status:stable Satisfied with current treatment? yes Medication side effects: no Medication compliance: good compliance Etiology of hypothyroidism:  Recent dose adjustment:no Fatigue: no Cold intolerance: no Heat intolerance: no Weight gain: no Weight loss: no Constipation: no Diarrhea/loose stools: no Palpitations: no Lower extremity edema: no Anxiety/depressed mood: no   Relevant past medical, surgical, family and social history reviewed and updated as indicated. Interim medical history since our last visit reviewed. Allergies and medications reviewed and updated.  Review of Systems  Constitutional: Negative for activity change, diaphoresis, fatigue and fever.  Respiratory: Negative for cough, chest tightness, shortness of breath and wheezing.   Cardiovascular: Negative for chest pain, palpitations and leg swelling.  Gastrointestinal: Negative for abdominal distention, abdominal pain, constipation, diarrhea, nausea and vomiting.  Neurological: Negative for dizziness, syncope, weakness, light-headedness, numbness and headaches.  Psychiatric/Behavioral: Negative.     Per HPI unless specifically indicated above     Objective:    There were no vitals taken for this visit.  Wt Readings from Last 3 Encounters:  02/18/19 212 lb (96.2 kg)  08/13/18 207 lb 3.2 oz (94 kg)  02/09/18 216 lb 11.2 oz (98.3 kg)    Physical Exam Vitals signs and nursing note reviewed.  Constitutional:      General: He is awake. He is not in acute distress.    Appearance: He is well-developed. He is not ill-appearing.  HENT:     Head: Normocephalic.     Right Ear: Hearing normal. No drainage.     Left Ear: Hearing normal. No drainage.  Eyes:     General: Lids are normal.        Right  eye: No discharge.        Left eye: No discharge.     Conjunctiva/sclera: Conjunctivae normal.  Neck:     Musculoskeletal: Normal range of motion.  Cardiovascular:     Comments: Unable to auscultate due to  virtual exam only  Pulmonary:     Effort: Pulmonary effort is normal. No accessory muscle usage or respiratory distress.     Comments: Unable to auscultate due to virtual exam only  Neurological:     Mental Status: He is alert and oriented to person, place, and time.  Psychiatric:        Mood and Affect: Mood normal.        Behavior: Behavior normal. Behavior is cooperative.        Thought Content: Thought content normal.        Judgment: Judgment normal.     Results for orders placed or performed in visit on 02/18/19  CBC with Differential/Platelet  Result Value Ref Range   WBC 5.8 3.4 - 10.8 x10E3/uL   RBC 4.33 4.14 - 5.80 x10E6/uL   Hemoglobin 14.3 13.0 - 17.7 g/dL   Hematocrit 41.4 37.5 - 51.0 %   MCV 96 79 - 97 fL   MCH 33.0 26.6 - 33.0 pg   MCHC 34.5 31.5 - 35.7 g/dL   RDW 12.0 11.6 - 15.4 %   Platelets 174 150 - 450 x10E3/uL   Neutrophils 67 Not Estab. %   Lymphs 18 Not Estab. %   Monocytes 8 Not Estab. %   Eos 5 Not Estab. %   Basos 1 Not Estab. %   Neutrophils Absolute 3.9 1.4 - 7.0 x10E3/uL   Lymphocytes Absolute 1.0 0.7 - 3.1 x10E3/uL   Monocytes Absolute 0.5 0.1 - 0.9 x10E3/uL   EOS (ABSOLUTE) 0.3 0.0 - 0.4 x10E3/uL   Basophils Absolute 0.1 0.0 - 0.2 x10E3/uL   Immature Granulocytes 1 Not Estab. %   Immature Grans (Abs) 0.0 0.0 - 0.1 x10E3/uL  Comprehensive metabolic panel  Result Value Ref Range   Glucose 106 (H) 65 - 99 mg/dL   BUN 14 6 - 24 mg/dL   Creatinine, Ser 1.03 0.76 - 1.27 mg/dL   GFR calc non Af Amer 81 >59 mL/min/1.73   GFR calc Af Amer 94 >59 mL/min/1.73   BUN/Creatinine Ratio 14 9 - 20   Sodium 139 134 - 144 mmol/L   Potassium 4.7 3.5 - 5.2 mmol/L   Chloride 103 96 - 106 mmol/L   CO2 23 20 - 29 mmol/L   Calcium 9.2 8.7 - 10.2 mg/dL   Total Protein 6.5 6.0 - 8.5 g/dL   Albumin 4.5 3.8 - 4.9 g/dL   Globulin, Total 2.0 1.5 - 4.5 g/dL   Albumin/Globulin Ratio 2.3 (H) 1.2 - 2.2   Bilirubin Total 0.7 0.0 - 1.2 mg/dL   Alkaline Phosphatase  89 39 - 117 IU/L   AST 23 0 - 40 IU/L   ALT 29 0 - 44 IU/L  Lipid Panel w/o Chol/HDL Ratio  Result Value Ref Range   Cholesterol, Total 173 100 - 199 mg/dL   Triglycerides 180 (H) 0 - 149 mg/dL   HDL 45 >39 mg/dL   VLDL Cholesterol Cal 36 5 - 40 mg/dL   LDL Calculated 92 0 - 99 mg/dL  PSA  Result Value Ref Range   Prostate Specific Ag, Serum 0.3 0.0 - 4.0 ng/mL  Thyroid Panel With TSH  Result Value Ref Range   TSH 2.880 0.450 - 4.500  uIU/mL   T4, Total 7.7 4.5 - 12.0 ug/dL   T3 Uptake Ratio 28 24 - 39 %   Free Thyroxine Index 2.2 1.2 - 4.9      Assessment & Plan:   Problem List Items Addressed This Visit      Cardiovascular and Mediastinum   Hypertension - Primary    Chronic, stable.  Continue current medication regimen.  Recommend obtaining BP cuff and checking BP three mornings a week at home.  Will obtain labs at annual in 6 months.        Endocrine   Hypothyroidism    Chronic, stable with TSH at goal for > 2 years.  Continue Levothyroxine at 150 MCG daily.  Will obtain labs at annual in 6 months, unless symptoms present then will check sooner.        Other   Hyperlipidemia    Chronic, ongoing with last LDL <100.  Continue current medication regimen.  Will obtain labs at annual in 6 months.         I discussed the assessment and treatment plan with the patient. The patient was provided an opportunity to ask questions and all were answered. The patient agreed with the plan and demonstrated an understanding of the instructions.   The patient was advised to call back or seek an in-person evaluation if the symptoms worsen or if the condition fails to improve as anticipated.   I provided 15 minutes of time during this encounter.  Follow up plan: Return in about 6 months (around 02/19/2020) for Annual physical.

## 2019-08-19 NOTE — Assessment & Plan Note (Signed)
Chronic, stable.  Continue current medication regimen.  Recommend obtaining BP cuff and checking BP three mornings a week at home.  Will obtain labs at annual in 6 months.

## 2019-08-19 NOTE — Patient Instructions (Signed)
Fat and Cholesterol Restricted Eating Plan Getting too much fat and cholesterol in your diet may cause health problems. Choosing the right foods helps keep your fat and cholesterol at normal levels. This can keep you from getting certain diseases. Your doctor may recommend an eating plan that includes:  Total fat: ______% or less of total calories a day.  Saturated fat: ______% or less of total calories a day.  Cholesterol: less than _________mg a day.  Fiber: ______g a day. What are tips for following this plan? Meal planning  At meals, divide your plate into four equal parts: ? Fill one-half of your plate with vegetables and green salads. ? Fill one-fourth of your plate with whole grains. ? Fill one-fourth of your plate with low-fat (lean) protein foods.  Eat fish that is high in omega-3 fats at least two times a week. This includes mackerel, tuna, sardines, and salmon.  Eat foods that are high in fiber, such as whole grains, beans, apples, broccoli, carrots, peas, and barley. General tips   Work with your doctor to lose weight if you need to.  Avoid: ? Foods with added sugar. ? Fried foods. ? Foods with partially hydrogenated oils.  Limit alcohol intake to no more than 1 drink a day for nonpregnant women and 2 drinks a day for men. One drink equals 12 oz of beer, 5 oz of wine, or 1 oz of hard liquor. Reading food labels  Check food labels for: ? Trans fats. ? Partially hydrogenated oils. ? Saturated fat (g) in each serving. ? Cholesterol (mg) in each serving. ? Fiber (g) in each serving.  Choose foods with healthy fats, such as: ? Monounsaturated fats. ? Polyunsaturated fats. ? Omega-3 fats.  Choose grain products that have whole grains. Look for the word "whole" as the first word in the ingredient list. Cooking  Cook foods using low-fat methods. These include baking, boiling, grilling, and broiling.  Eat more home-cooked foods. Eat at restaurants and buffets  less often.  Avoid cooking using saturated fats, such as butter, cream, palm oil, palm kernel oil, and coconut oil. Recommended foods  Fruits  All fresh, canned (in natural juice), or frozen fruits. Vegetables  Fresh or frozen vegetables (raw, steamed, roasted, or grilled). Green salads. Grains  Whole grains, such as whole wheat or whole grain breads, crackers, cereals, and pasta. Unsweetened oatmeal, bulgur, barley, quinoa, or brown rice. Corn or whole wheat flour tortillas. Meats and other protein foods  Ground beef (85% or leaner), grass-fed beef, or beef trimmed of fat. Skinless chicken or turkey. Ground chicken or turkey. Pork trimmed of fat. All fish and seafood. Egg whites. Dried beans, peas, or lentils. Unsalted nuts or seeds. Unsalted canned beans. Nut butters without added sugar or oil. Dairy  Low-fat or nonfat dairy products, such as skim or 1% milk, 2% or reduced-fat cheeses, low-fat and fat-free ricotta or cottage cheese, or plain low-fat and nonfat yogurt. Fats and oils  Tub margarine without trans fats. Light or reduced-fat mayonnaise and salad dressings. Avocado. Olive, canola, sesame, or safflower oils. The items listed above may not be a complete list of foods and beverages you can eat. Contact a dietitian for more information. Foods to avoid Fruits  Canned fruit in heavy syrup. Fruit in cream or butter sauce. Fried fruit. Vegetables  Vegetables cooked in cheese, cream, or butter sauce. Fried vegetables. Grains  White bread. White pasta. White rice. Cornbread. Bagels, pastries, and croissants. Crackers and snack foods that contain trans fat   and hydrogenated oils. Meats and other protein foods  Fatty cuts of meat. Ribs, chicken wings, bacon, sausage, bologna, salami, chitterlings, fatback, hot dogs, bratwurst, and packaged lunch meats. Liver and organ meats. Whole eggs and egg yolks. Chicken and turkey with skin. Fried meat. Dairy  Whole or 2% milk, cream,  half-and-half, and cream cheese. Whole milk cheeses. Whole-fat or sweetened yogurt. Full-fat cheeses. Nondairy creamers and whipped toppings. Processed cheese, cheese spreads, and cheese curds. Beverages  Alcohol. Sugar-sweetened drinks such as sodas, lemonade, and fruit drinks. Fats and oils  Butter, stick margarine, lard, shortening, ghee, or bacon fat. Coconut, palm kernel, and palm oils. Sweets and desserts  Corn syrup, sugars, honey, and molasses. Candy. Jam and jelly. Syrup. Sweetened cereals. Cookies, pies, cakes, donuts, muffins, and ice cream. The items listed above may not be a complete list of foods and beverages you should avoid. Contact a dietitian for more information. Summary  Choosing the right foods helps keep your fat and cholesterol at normal levels. This can keep you from getting certain diseases.  At meals, fill one-half of your plate with vegetables and green salads.  Eat high-fiber foods, like whole grains, beans, apples, carrots, peas, and barley.  Limit added sugar, saturated fats, alcohol, and fried foods. This information is not intended to replace advice given to you by your health care provider. Make sure you discuss any questions you have with your health care provider. Document Released: 06/09/2012 Document Revised: 08/12/2018 Document Reviewed: 08/26/2017 Elsevier Patient Education  2020 [employer]  

## 2019-08-19 NOTE — Assessment & Plan Note (Signed)
Chronic, stable with TSH at goal for > 2 years.  Continue Levothyroxine at 150 MCG daily.  Will obtain labs at annual in 6 months, unless symptoms present then will check sooner.

## 2019-08-19 NOTE — Assessment & Plan Note (Signed)
Chronic, ongoing with last LDL <100.  Continue current medication regimen.  Will obtain labs at annual in 6 months.

## 2019-08-24 MED FILL — ATENOLOL 100 MG TAB: 100 | 90 days supply | Qty: 90 | Fill #2

## 2019-10-04 MED FILL — LEVOTHYROXINE 150 MCG TAB: 150 | 90 days supply | Qty: 90 | Fill #1

## 2019-10-21 MED FILL — ATORVASTATIN 10 MG TABLET: 10 | 90 days supply | Qty: 90 | Fill #1

## 2019-10-28 DIAGNOSIS — H524 Presbyopia: Secondary | ICD-10-CM | POA: Diagnosis not present

## 2019-11-22 MED FILL — ATENOLOL 100 MG TABLET: 100 | 90 days supply | Qty: 90 | Fill #3

## 2020-01-03 MED FILL — LEVOTHYROXINE SODIUM 150 MC: 150 | 90 days supply | Qty: 90 | Fill #2

## 2020-01-24 MED FILL — ATORVASTATIN 10 MG TABLET: 10 | 90 days supply | Qty: 90 | Fill #2

## 2020-02-07 ENCOUNTER — Other Ambulatory Visit: Payer: Self-pay | Admitting: Unknown Physician Specialty

## 2020-02-07 DIAGNOSIS — M1A9XX0 Chronic gout, unspecified, without tophus (tophi): Secondary | ICD-10-CM

## 2020-02-07 MED FILL — INDOMETHACIN 50 MG CAPSULE: 50 | 30 days supply | Qty: 90 | Fill #0

## 2020-02-21 ENCOUNTER — Other Ambulatory Visit: Payer: Self-pay | Admitting: Nurse Practitioner

## 2020-02-21 ENCOUNTER — Other Ambulatory Visit: Payer: Self-pay

## 2020-02-21 ENCOUNTER — Encounter: Payer: Self-pay | Admitting: Nurse Practitioner

## 2020-02-21 ENCOUNTER — Ambulatory Visit (INDEPENDENT_AMBULATORY_CARE_PROVIDER_SITE_OTHER): Payer: 59 | Admitting: Nurse Practitioner

## 2020-02-21 VITALS — BP 128/78 | HR 77 | Temp 97.5°F | Ht 70.18 in | Wt 214.8 lb

## 2020-02-21 DIAGNOSIS — E6609 Other obesity due to excess calories: Secondary | ICD-10-CM

## 2020-02-21 DIAGNOSIS — Z0000 Encounter for general adult medical examination without abnormal findings: Secondary | ICD-10-CM

## 2020-02-21 DIAGNOSIS — E039 Hypothyroidism, unspecified: Secondary | ICD-10-CM

## 2020-02-21 DIAGNOSIS — N522 Drug-induced erectile dysfunction: Secondary | ICD-10-CM | POA: Diagnosis not present

## 2020-02-21 DIAGNOSIS — Z6830 Body mass index (BMI) 30.0-30.9, adult: Secondary | ICD-10-CM

## 2020-02-21 DIAGNOSIS — M1A9XX0 Chronic gout, unspecified, without tophus (tophi): Secondary | ICD-10-CM

## 2020-02-21 DIAGNOSIS — I10 Essential (primary) hypertension: Secondary | ICD-10-CM

## 2020-02-21 DIAGNOSIS — Z125 Encounter for screening for malignant neoplasm of prostate: Secondary | ICD-10-CM | POA: Diagnosis not present

## 2020-02-21 DIAGNOSIS — N529 Male erectile dysfunction, unspecified: Secondary | ICD-10-CM | POA: Insufficient documentation

## 2020-02-21 DIAGNOSIS — E781 Pure hyperglyceridemia: Secondary | ICD-10-CM | POA: Diagnosis not present

## 2020-02-21 MED ORDER — ATORVASTATIN CALCIUM 10 MG PO TABS: 10.0000 mg | ORAL_TABLET | Freq: Every day | ORAL | 3 refills | Status: DC

## 2020-02-21 MED ORDER — LEVOTHYROXINE SODIUM 150 MCG PO TABS: 150.0000 ug | ORAL_TABLET | Freq: Every day | ORAL | 3 refills | Status: DC

## 2020-02-21 MED ORDER — ATENOLOL 100 MG PO TABS: 100.0000 mg | ORAL_TABLET | Freq: Every day | ORAL | 3 refills | Status: DC

## 2020-02-21 MED ORDER — TADALAFIL 10 MG PO TABS: 10.0000 mg | ORAL_TABLET | Freq: Every day | ORAL | 0 refills | Status: DC | PRN

## 2020-02-21 MED FILL — ATENOLOL 100 MG TABLET: 100 | 90 days supply | Qty: 90 | Fill #0

## 2020-02-21 MED FILL — TADALAFIL 10 MG TABS: 10 | 30 days supply | Qty: 6 | Fill #0

## 2020-02-21 NOTE — Assessment & Plan Note (Signed)
Chronic, stable with no maintenance medications.  Continue Indocin as needed only.  No flare in > 3 years.  Uric acid level today.

## 2020-02-21 NOTE — Patient Instructions (Signed)
Blood Pressure Record Sheet To take your blood pressure, you will need a blood pressure machine. You can buy a blood pressure machine (blood pressure monitor) at your clinic, drug store, or online. When choosing one, consider:  An automatic monitor that has an arm cuff.  A cuff that wraps snugly around your upper arm. You should be able to fit only one finger between your arm and the cuff.  A device that stores blood pressure reading results.  Do not choose a monitor that measures your blood pressure from your wrist or finger. Follow your health care provider's instructions for how to take your blood pressure. To use this form:  Get one reading in the morning (a.m.) before you take any medicines.  Get one reading in the evening (p.m.) before supper.  Take at least 2 readings with each blood pressure check. This makes sure the results are correct. Wait 1-2 minutes between measurements.  Write down the results in the spaces on this form.  Repeat this once a week, or as told by your health care provider.  Make a follow-up appointment with your health care provider to discuss the results. Blood pressure log Date: _______________________  a.m. _____________________(1st reading) _____________________(2nd reading)  p.m. _____________________(1st reading) _____________________(2nd reading) Date: _______________________  a.m. _____________________(1st reading) _____________________(2nd reading)  p.m. _____________________(1st reading) _____________________(2nd reading) Date: _______________________  a.m. _____________________(1st reading) _____________________(2nd reading)  p.m. _____________________(1st reading) _____________________(2nd reading) Date: _______________________  a.m. _____________________(1st reading) _____________________(2nd reading)  p.m. _____________________(1st reading) _____________________(2nd reading) Date: _______________________  a.m.  _____________________(1st reading) _____________________(2nd reading)  p.m. _____________________(1st reading) _____________________(2nd reading) This information is not intended to replace advice given to you by your health care provider. Make sure you discuss any questions you have with your health care provider. Document Revised: 02/06/2018 Document Reviewed: 12/09/2017 Else[employer] Patient Education  2020 [employer]  

## 2020-02-21 NOTE — Assessment & Plan Note (Signed)
Chronic, ongoing.  Initial BP elevated, but repeat manually was at goal.  Continue current medication regimen and adjust as needed.  Refills sent in.  Recommend obtaining BP cuff and checking BP three mornings a week at home.  Obtain labs today.  Return in 6 months.

## 2020-02-21 NOTE — Progress Notes (Signed)
BP 128/78 (BP Location: Left Arm)   Pulse 77   Temp (!) 97.5 F (36.4 C) (Oral)   Ht 5' 10.18" (1.783 m)   Wt 214 lb 12.8 oz (97.4 kg)   SpO2 97%   BMI 30.67 kg/m    Subjective:    Patient ID: George Hawkins, male    DOB: 07-Jul-1961, 59 y.o.   MRN: [id number]  HPI: George Hawkins is a 59 y.o. male presenting on 02/21/2020 for comprehensive medical examination. Current medical complaints include:none  He currently lives with: wife Interim Problems from his last visit: no   HYPERTENSION / HYPERLIPIDEMIA Currently on Atenolol daily and Lipitor.  Has not taken medication this morning.  He has a history of gout, but has not had flare in over 3 years.  No current chronic gout medications, has Indocin script he can use as needed. Satisfied with current treatment? yes Duration of hypertension: chronic BP monitoring frequency: not checking BP range:  BP medication side effects: no Duration of hyperlipidemia: chronic Cholesterol medication side effects: no Cholesterol supplements: fish oil  Medication compliance: good compliance Aspirin: yes Recent stressors: no Recurrent headaches: no Visual changes: no Palpitations: no Dyspnea: no Chest pain: no Lower extremity edema: no Dizzy/lightheaded: no   HYPOTHYROIDISM Currently taking Levothyroxine 150 MCG daily with last TSH February 2.880 and T4 7.7.  Thyroid control status:stable Satisfied with current treatment? yes Medication side effects: no Medication compliance: good compliance Etiology of hypothyroidism:  Recent dose adjustment:no Fatigue: no Cold intolerance: no Heat intolerance: no Weight gain: no Weight loss: no Constipation: no Diarrhea/loose stools: no Palpitations: no Lower extremity edema: no Anxiety/depressed mood: no   ERECTILE DYSFUNCTION Started about 1 1/2 years ago.  Can achieve erection, but has difficulty maintaining.  Would like to try an ED medication.  Denies any family history of MI or  CVA.  Functional Status Survey: Is the patient deaf or have difficulty hearing?: No Does the patient have difficulty seeing, even when wearing glasses/contacts?: No Does the patient have difficulty concentrating, remembering, or making decisions?: No Does the patient have difficulty walking or climbing stairs?: No Does the patient have difficulty dressing or bathing?: No Does the patient have difficulty doing errands alone such as visiting a doctor's office or shopping?: No  FALL RISK: Fall Risk  02/18/2019 02/09/2018 11/04/2016  Falls in the past year? 0 No No  Follow up Falls evaluation completed - -    Depression Screen Depression screen [facility] 2/9 02/21/2020 02/18/2019 02/09/2018 11/04/2016 10/30/2015  Decreased Interest 0 0 0 0 0  Down, Depressed, Hopeless 0 0 0 0 0  PHQ - 2 Score 0 0 0 0 0  Altered sleeping 0 0 0 - -  Tired, decreased energy 0 0 0 - -  Change in appetite 0 0 0 - -  Feeling bad or failure about yourself  0 0 0 - -  Trouble concentrating 0 0 0 - -  Moving slowly or fidgety/restless 0 0 0 - -  Suicidal thoughts 0 0 0 - -  PHQ-9 Score 0 0 0 - -  Difficult doing work/chores Not difficult at all Not difficult at all - - -    Past Medical History:  Past Medical History:  Diagnosis Date  . Gout   . Hyperlipidemia   . Hypertension   . Hypothyroidism     Surgical History:  Past Surgical History:  Procedure Laterality Date  . TENDON REPAIR Right    Hand  . VASECTOMY  Medications:  Current Outpatient Medications on File Prior to Visit  Medication Sig  . aspirin 81 MG chewable tablet Chew 81 mg by mouth daily.  . Fish Oil OIL by Does not apply route.  . indomethacin (INDOCIN) 50 MG capsule TAKE ONE CAPSULE BY MOUTH THREE TIMES DAILY AS NEEDED   No current facility-administered medications on file prior to visit.    Allergies:  No Known Allergies  Social History:  Social History   Socioeconomic History  . Marital status: Married    Spouse name:  Not on file  . Number of children: Not on file  . Years of education: Not on file  . Highest education level: Not on file  Occupational History  . Not on file  Tobacco Use  . Smoking status: Never Smoker  . Smokeless tobacco: Never Used  Substance and Sexual Activity  . Alcohol use: Yes    Alcohol/week: 14.0 standard drinks    Types: 14 Cans of beer per week  . Drug use: No  . Sexual activity: Yes  Other Topics Concern  . Not on file  Social History Narrative  . Not on file   Social Determinants of Health   Financial Resource Strain:   . Difficulty of Paying Living Expenses: Not on file  Food Insecurity:   . Worried About Charity fundraiser in the Last Year: Not on file  . Ran Out of Food in the Last Year: Not on file  Transportation Needs:   . Lack of Transportation (Medical): Not on file  . Lack of Transportation (Non-Medical): Not on file  Physical Activity:   . Days of Exercise per Week: Not on file  . Minutes of Exercise per Session: Not on file  Stress:   . Feeling of Stress : Not on file  Social Connections:   . Frequency of Communication with Friends and Family: Not on file  . Frequency of Social Gatherings with Friends and Family: Not on file  . Attends Religious Services: Not on file  . Active Member of Clubs or Organizations: Not on file  . Attends Archivist Meetings: Not on file  . Marital Status: Not on file  Intimate Partner Violence:   . Fear of Current or Ex-Partner: Not on file  . Emotionally Abused: Not on file  . Physically Abused: Not on file  . Sexually Abused: Not on file   Social History   Tobacco Use  Smoking Status Never Smoker  Smokeless Tobacco Never Used   Social History   Substance and Sexual Activity  Alcohol Use Yes  . Alcohol/week: 14.0 standard drinks  . Types: 14 Cans of beer per week    Family History:  Family History  Problem Relation Age of Onset  . Thyroid disease Brother   . Thyroid disease Sister    . Thyroid disease Brother   . Heart attack Neg Hx   . Hyperlipidemia Neg Hx   . Hypertension Neg Hx   . Sudden death Neg Hx     Past medical history, surgical history, medications, allergies, family history and social history reviewed with patient today and changes made to appropriate areas of the chart.   Review of Systems - Negative All other ROS negative except what is listed above and in the HPI.      Objective:    BP 128/78 (BP Location: Left Arm)   Pulse 77   Temp (!) 97.5 F (36.4 C) (Oral)   Ht 5' 10.18" (1.783 m)  Wt 214 lb 12.8 oz (97.4 kg)   SpO2 97%   BMI 30.67 kg/m   Wt Readings from Last 3 Encounters:  02/21/20 214 lb 12.8 oz (97.4 kg)  02/18/19 212 lb (96.2 kg)  08/13/18 207 lb 3.2 oz (94 kg)    Physical Exam Vitals and nursing note reviewed.  Constitutional:      General: He is awake.     Appearance: He is well-developed.  HENT:     Head: Normocephalic and atraumatic.     Right Ear: Hearing, tympanic membrane, ear canal and external ear normal. No decreased hearing noted. No drainage.     Left Ear: Hearing, tympanic membrane, ear canal and external ear normal. No decreased hearing noted. No drainage.     Nose: Nose normal.     Right Sinus: No maxillary sinus tenderness or frontal sinus tenderness.     Left Sinus: No maxillary sinus tenderness or frontal sinus tenderness.     Mouth/Throat:     Pharynx: Uvula midline.  Eyes:     General: Lids are normal.        Right eye: No discharge.        Left eye: No discharge.     Conjunctiva/sclera: Conjunctivae normal.     Pupils: Pupils are equal, round, and reactive to light.     Visual Fields: Right eye visual fields normal and left eye visual fields normal.  Neck:     Thyroid: No thyromegaly.     Vascular: No carotid bruit or JVD.     Trachea: Trachea normal.  Cardiovascular:     Rate and Rhythm: Normal rate and regular rhythm.     Heart sounds: Normal heart sounds, S1 normal and S2 normal. No  murmur. No gallop.   Pulmonary:     Effort: Pulmonary effort is normal.     Breath sounds: Normal breath sounds.  Abdominal:     General: Bowel sounds are normal.     Palpations: Abdomen is soft. There is no hepatomegaly or splenomegaly.  Genitourinary:    Penis: Normal.      Rectum: Normal.  Musculoskeletal:        General: Normal range of motion.     Cervical back: Normal range of motion and neck supple.     Right lower leg: No edema.     Left lower leg: No edema.  Lymphadenopathy:     Cervical: No cervical adenopathy.  Skin:    General: Skin is warm and dry.     Capillary Refill: Capillary refill takes less than 2 seconds.     Findings: No rash.  Neurological:     Mental Status: He is alert and oriented to person, place, and time.     Cranial Nerves: Cranial nerves are intact.     Sensory: Sensation is intact.     Motor: Motor function is intact.     Coordination: Coordination is intact.     Gait: Gait is intact.     Deep Tendon Reflexes: Reflexes are normal and symmetric.     Reflex Scores:      Brachioradialis reflexes are 2+ on the right side and 2+ on the left side.      Patellar reflexes are 2+ on the right side and 2+ on the left side. Psychiatric:        Attention and Perception: Attention normal.        Mood and Affect: Mood normal.        Speech: Speech normal.  Behavior: Behavior normal. Behavior is cooperative.        Thought Content: Thought content normal.        Cognition and Memory: Cognition normal.        Judgment: Judgment normal.     Results for orders placed or performed in visit on 02/18/19  CBC with Differential/Platelet  Result Value Ref Range   WBC 5.8 3.4 - 10.8 x10E3/uL   RBC 4.33 4.14 - 5.80 x10E6/uL   Hemoglobin 14.3 13.0 - 17.7 g/dL   Hematocrit 41.4 37.5 - 51.0 %   MCV 96 79 - 97 fL   MCH 33.0 26.6 - 33.0 pg   MCHC 34.5 31.5 - 35.7 g/dL   RDW 12.0 11.6 - 15.4 %   Platelets 174 150 - 450 x10E3/uL   Neutrophils 67 Not Estab.  %   Lymphs 18 Not Estab. %   Monocytes 8 Not Estab. %   Eos 5 Not Estab. %   Basos 1 Not Estab. %   Neutrophils Absolute 3.9 1.4 - 7.0 x10E3/uL   Lymphocytes Absolute 1.0 0.7 - 3.1 x10E3/uL   Monocytes Absolute 0.5 0.1 - 0.9 x10E3/uL   EOS (ABSOLUTE) 0.3 0.0 - 0.4 x10E3/uL   Basophils Absolute 0.1 0.0 - 0.2 x10E3/uL   Immature Granulocytes 1 Not Estab. %   Immature Grans (Abs) 0.0 0.0 - 0.1 x10E3/uL  Comprehensive metabolic panel  Result Value Ref Range   Glucose 106 (H) 65 - 99 mg/dL   BUN 14 6 - 24 mg/dL   Creatinine, Ser 1.03 0.76 - 1.27 mg/dL   GFR calc non Af Amer 81 >59 mL/min/1.73   GFR calc Af Amer 94 >59 mL/min/1.73   BUN/Creatinine Ratio 14 9 - 20   Sodium 139 134 - 144 mmol/L   Potassium 4.7 3.5 - 5.2 mmol/L   Chloride 103 96 - 106 mmol/L   CO2 23 20 - 29 mmol/L   Calcium 9.2 8.7 - 10.2 mg/dL   Total Protein 6.5 6.0 - 8.5 g/dL   Albumin 4.5 3.8 - 4.9 g/dL   Globulin, Total 2.0 1.5 - 4.5 g/dL   Albumin/Globulin Ratio 2.3 (H) 1.2 - 2.2   Bilirubin Total 0.7 0.0 - 1.2 mg/dL   Alkaline Phosphatase 89 39 - 117 IU/L   AST 23 0 - 40 IU/L   ALT 29 0 - 44 IU/L  Lipid Panel w/o Chol/HDL Ratio  Result Value Ref Range   Cholesterol, Total 173 100 - 199 mg/dL   Triglycerides 180 (H) 0 - 149 mg/dL   HDL 45 >39 mg/dL   VLDL Cholesterol Cal 36 5 - 40 mg/dL   LDL Calculated 92 0 - 99 mg/dL  PSA  Result Value Ref Range   Prostate Specific Ag, Serum 0.3 0.0 - 4.0 ng/mL  Thyroid Panel With TSH  Result Value Ref Range   TSH 2.880 0.450 - 4.500 uIU/mL   T4, Total 7.7 4.5 - 12.0 ug/dL   T3 Uptake Ratio 28 24 - 39 %   Free Thyroxine Index 2.2 1.2 - 4.9      Assessment & Plan:   Problem List Items Addressed This Visit      Cardiovascular and Mediastinum   Hypertension    Chronic, ongoing.  Initial BP elevated, but repeat manually was at goal.  Continue current medication regimen and adjust as needed.  Refills sent in.  Recommend obtaining BP cuff and checking BP three  mornings a week at home.  Obtain labs today.  Return in  6 months.      Relevant Medications   atenolol (TENORMIN) 100 MG tablet   atorvastatin (LIPITOR) 10 MG tablet   tadalafil (CIALIS) 10 MG tablet   Other Relevant Orders   CBC with Differential/Platelet   Comprehensive metabolic panel     Endocrine   Hypothyroidism    Chronic, stable with TSH at goal for > 2 years.  Continue Levothyroxine at 150 MCG daily and adjust as needed.  Obtain labs today.       Relevant Medications   atenolol (TENORMIN) 100 MG tablet   levothyroxine (SYNTHROID) 150 MCG tablet   Other Relevant Orders   Thyroid Panel With TSH     Other   Hyperlipidemia    Chronic, ongoing with last LDL <100.  Continue current medication regimen and adjust as needed.  Lipid panel today.  Return in 6 months.      Relevant Medications   atenolol (TENORMIN) 100 MG tablet   atorvastatin (LIPITOR) 10 MG tablet   tadalafil (CIALIS) 10 MG tablet   Other Relevant Orders   Comprehensive metabolic panel   Lipid Panel w/o Chol/HDL Ratio   Chronic gout    Chronic, stable with no maintenance medications.  Continue Indocin as needed only.  No flare in > 3 years.  Uric acid level today.      Relevant Orders   Uric acid   Obesity    Recommend focus on healthy diet choices and regular physical activity (30 minutes a day 5 days a week).      Erectile dysfunction    New for 1 1/2 years since BP medications on board.  Will trial Cialis as needed.  Script sent.  Educated patient on this medications risks/benefits + side effects to monitor for.         Other Visit Diagnoses    Annual physical exam    -  Primary   Annual labs to include CBC, CMP, TSH, lipid, PSA   Prostate cancer screening       PSA annually -- obtained today.  Discussed with patient and joint decision made.   Relevant Orders   PSA      Discussed aspirin prophylaxis for myocardial infarction prevention and decision was made to continue ASA  LABORATORY  TESTING:  Health maintenance labs ordered today as discussed above.   The natural history of prostate cancer and ongoing controversy regarding screening and potential treatment outcomes of prostate cancer has been discussed with the patient. The meaning of a false positive PSA and a false negative PSA has been discussed. He indicates understanding of the limitations of this screening test and wishes to proceed with screening PSA testing.   IMMUNIZATIONS:   - Tdap: Tetanus vaccination status reviewed: up to date - Influenza: Refused - Pneumovax: Not applicable - Prevnar: Not applicable - Zostavax vaccine: Refused  SCREENING: - Colonoscopy: Up to date  Discussed with patient purpose of the colonoscopy is to detect colon cancer at curable precancerous or early stages   - AAA Screening: Not applicable  -Hearing Test: Not applicable  -Spirometry: Not applicable   PATIENT COUNSELING:    Sexuality: Discussed sexually transmitted diseases, partner selection, use of condoms, avoidance of unintended pregnancy  and contraceptive alternatives.   Advised to avoid cigarette smoking.  I discussed with the patient that most people either abstain from alcohol or drink within safe limits (<=14/week and <=4 drinks/occasion for males, <=7/weeks and <= 3 drinks/occasion for females) and that the risk for alcohol disorders and  other health effects rises proportionally with the number of drinks per week and how often a drinker exceeds daily limits.  Discussed cessation/primary prevention of drug use and availability of treatment for abuse.   Diet: Encouraged to adjust caloric intake to maintain  or achieve ideal body weight, to reduce intake of dietary saturated fat and total fat, to limit sodium intake by avoiding high sodium foods and not adding table salt, and to maintain adequate dietary potassium and calcium preferably from fresh fruits, vegetables, and low-fat dairy products.    stressed the  importance of regular exercise  Injury prevention: Discussed safety belts, safety helmets, smoke detector, smoking near bedding or upholstery.   Dental health: Discussed importance of regular tooth brushing, flossing, and dental visits.   Follow up plan: NEXT PREVENTATIVE PHYSICAL DUE IN 1 YEAR. Return in about 6 months (around 08/23/2020) for HTN/HLD, Hypothyroid.

## 2020-02-21 NOTE — Assessment & Plan Note (Signed)
New for 1 1/2 years since BP medications on board.  Will trial Cialis as needed.  Script sent.  Educated patient on this medications risks/benefits + side effects to monitor for.

## 2020-02-21 NOTE — Assessment & Plan Note (Signed)
Chronic, stable with TSH at goal for > 2 years.  Continue Levothyroxine at 150 MCG daily and adjust as needed.  Obtain labs today.

## 2020-02-21 NOTE — Assessment & Plan Note (Signed)
Chronic, ongoing with last LDL <100.  Continue current medication regimen and adjust as needed.  Lipid panel today.  Return in 6 months.

## 2020-02-21 NOTE — Assessment & Plan Note (Signed)
Recommend focus on healthy diet choices and regular physical activity (30 minutes a day 5 days a week).

## 2020-02-22 LAB — CBC WITH DIFFERENTIAL/PLATELET
Basophils Absolute: 0 10*3/uL (ref 0.0–0.2)
Basos: 1 %
EOS (ABSOLUTE): 0.3 10*3/uL (ref 0.0–0.4)
Eos: 5 %
Hematocrit: 39.9 % (ref 37.5–51.0)
Hemoglobin: 13.9 g/dL (ref 13.0–17.7)
Immature Grans (Abs): 0 10*3/uL (ref 0.0–0.1)
Immature Granulocytes: 1 %
Lymphocytes Absolute: 0.9 10*3/uL (ref 0.7–3.1)
Lymphs: 18 %
MCH: 32.9 pg (ref 26.6–33.0)
MCHC: 34.8 g/dL (ref 31.5–35.7)
MCV: 95 fL (ref 79–97)
Monocytes Absolute: 0.4 10*3/uL (ref 0.1–0.9)
Monocytes: 9 %
Neutrophils Absolute: 3.3 10*3/uL (ref 1.4–7.0)
Neutrophils: 66 %
Platelets: 155 10*3/uL (ref 150–450)
RBC: 4.22 x10E6/uL (ref 4.14–5.80)
RDW: 12.1 % (ref 11.6–15.4)
WBC: 5 10*3/uL (ref 3.4–10.8)

## 2020-02-22 LAB — THYROID PANEL WITH TSH
Free Thyroxine Index: 1.8 (ref 1.2–4.9)
T3 Uptake Ratio: 24 % (ref 24–39)
T4, Total: 7.6 ug/dL (ref 4.5–12.0)
TSH: 3.06 u[IU]/mL (ref 0.450–4.500)

## 2020-02-22 LAB — COMPREHENSIVE METABOLIC PANEL
ALT: 33 IU/L (ref 0–44)
AST: 26 IU/L (ref 0–40)
Albumin/Globulin Ratio: 2.1 (ref 1.2–2.2)
Albumin: 4.7 g/dL (ref 3.8–4.9)
Alkaline Phosphatase: 101 IU/L (ref 39–117)
BUN/Creatinine Ratio: 14 (ref 9–20)
BUN: 15 mg/dL (ref 6–24)
Bilirubin Total: 0.7 mg/dL (ref 0.0–1.2)
CO2: 19 mmol/L — ABNORMAL LOW (ref 20–29)
Calcium: 9.6 mg/dL (ref 8.7–10.2)
Chloride: 102 mmol/L (ref 96–106)
Creatinine, Ser: 1.06 mg/dL (ref 0.76–1.27)
GFR calc Af Amer: 90 mL/min/{1.73_m2} (ref >59)
GFR calc non Af Amer: 78 mL/min/{1.73_m2} (ref >59)
Globulin, Total: 2.2 g/dL (ref 1.5–4.5)
Glucose: 103 mg/dL — ABNORMAL HIGH (ref 65–99)
Potassium: 4.1 mmol/L (ref 3.5–5.2)
Sodium: 136 mmol/L (ref 134–144)
Total Protein: 6.9 g/dL (ref 6.0–8.5)

## 2020-02-22 LAB — LIPID PANEL W/O CHOL/HDL RATIO
Cholesterol, Total: 187 mg/dL (ref 100–199)
HDL: 45 mg/dL (ref >39)
LDL Chol Calc (NIH): 77 mg/dL (ref 0–99)
Triglycerides: 407 mg/dL — ABNORMAL HIGH (ref 0–149)
VLDL Cholesterol Cal: 65 mg/dL — ABNORMAL HIGH (ref 5–40)

## 2020-02-22 LAB — URIC ACID: Uric Acid: 10.9 mg/dL — ABNORMAL HIGH (ref 3.8–8.4)

## 2020-02-22 LAB — PSA: Prostate Specific Ag, Serum: 0.3 ng/mL (ref 0.0–4.0)

## 2020-02-22 NOTE — Progress Notes (Signed)
Contacted via MyChart Good evening George Hawkins, your labs have returned: - Uric acid is elevated.  Have you had any pain lately?  It may be good to start you on a daily medication to keep this level down and avoid flares, it is called Allopurinol.  If you are okay with starting this let me know. - CBC is normal and prostate lab normal - Kidney and liver function are normal - Cholesterol shows good LDL and total cholesterol, but triglycerides slightly elevated.  Did you eat before visit?  If so continue Atorvastatin.  We will recheck this next visit and may add another medication if triglycerides remain elevated. - thyroid testing is normal -- continue current dosing Levothyroxine Have a good evening!!

## 2020-03-31 MED FILL — LEVOTHYROXINE SODIUM 150 MC: 150 | 90 days supply | Qty: 90 | Fill #0

## 2020-04-21 MED FILL — ATORVASTATIN 10 MG TABLET: 10 | 90 days supply | Qty: 90 | Fill #0

## 2020-05-23 MED FILL — ATENOLOL 100 MG TAB: 100 | 90 days supply | Qty: 90 | Fill #1

## 2020-06-30 MED FILL — LEVOTHYROXINE SODIUM 150 MC: 150 | 90 days supply | Qty: 90 | Fill #1

## 2020-07-21 MED FILL — ATORVASTATIN CALCIUM 10 MG: 10 | 90 days supply | Qty: 90 | Fill #1

## 2020-08-07 MED FILL — TADALAFIL 10 MG TABS: 10 | 20 days supply | Qty: 4 | Fill #1

## 2020-08-23 ENCOUNTER — Other Ambulatory Visit: Payer: Self-pay

## 2020-08-23 ENCOUNTER — Encounter: Payer: Self-pay | Admitting: Nurse Practitioner

## 2020-08-23 ENCOUNTER — Ambulatory Visit (INDEPENDENT_AMBULATORY_CARE_PROVIDER_SITE_OTHER): Payer: 59 | Admitting: Nurse Practitioner

## 2020-08-23 VITALS — BP 129/85 | HR 68 | Temp 97.7°F | Wt 219.0 lb

## 2020-08-23 DIAGNOSIS — E6609 Other obesity due to excess calories: Secondary | ICD-10-CM

## 2020-08-23 DIAGNOSIS — I10 Essential (primary) hypertension: Secondary | ICD-10-CM | POA: Diagnosis not present

## 2020-08-23 DIAGNOSIS — N522 Drug-induced erectile dysfunction: Secondary | ICD-10-CM

## 2020-08-23 DIAGNOSIS — Z6831 Body mass index (BMI) 31.0-31.9, adult: Secondary | ICD-10-CM | POA: Diagnosis not present

## 2020-08-23 DIAGNOSIS — E039 Hypothyroidism, unspecified: Secondary | ICD-10-CM

## 2020-08-23 DIAGNOSIS — E781 Pure hyperglyceridemia: Secondary | ICD-10-CM | POA: Diagnosis not present

## 2020-08-23 MED ORDER — TADALAFIL 10 MG PO TABS: 10.0000 mg | ORAL_TABLET | Freq: Every day | ORAL | 5 refills | Status: DC | PRN

## 2020-08-23 MED FILL — ATENOLOL 100 MG TAB: 100 | 90 days supply | Qty: 90 | Fill #2

## 2020-08-23 MED FILL — TADALAFIL 10 MG TABS: 10 | 50 days supply | Qty: 10 | Fill #0

## 2020-08-23 NOTE — Assessment & Plan Note (Signed)
BMI 31.26.  Recommended eating smaller high protein, low fat meals more frequently and exercising 30 mins a day 5 times a week with a goal of 10-15lb weight loss in the next 3 months. Patient voiced their understanding and motivation to adhere to these recommendations.

## 2020-08-23 NOTE — Patient Instructions (Signed)
DASH Eating Plan DASH stands for "Dietary Approaches to Stop Hypertension." The DASH eating plan is a healthy eating plan that has been shown to reduce high blood pressure (hypertension). It may also reduce your risk for type 2 diabetes, heart disease, and stroke. The DASH eating plan may also help with weight loss. What are tips for following this plan?  General guidelines  Avoid eating more than 2,300 mg (milligrams) of salt (sodium) a day. If you have hypertension, you may need to reduce your sodium intake to 1,500 mg a day.  Limit alcohol intake to no more than 1 drink a day for nonpregnant women and 2 drinks a day for men. One drink equals 12 oz of beer, 5 oz of wine, or 1 oz of hard liquor.  Work with your health care provider to maintain a healthy body weight or to lose weight. Ask what an ideal weight is for you.  Get at least 30 minutes of exercise that causes your heart to beat faster (aerobic exercise) most days of the week. Activities may include walking, swimming, or biking.  Work with your health care provider or diet and nutrition specialist (dietitian) to adjust your eating plan to your individual calorie needs. Reading food labels   Check food labels for the amount of sodium per serving. Choose foods with less than 5 percent of the Daily Value of sodium. Generally, foods with less than 300 mg of sodium per serving fit into this eating plan.  To find whole grains, look for the word "whole" as the first word in the ingredient list. Shopping  Buy products labeled as "low-sodium" or "no salt added."  Buy fresh foods. Avoid canned foods and premade or frozen meals. Cooking  Avoid adding salt when cooking. Use salt-free seasonings or herbs instead of table salt or sea salt. Check with your health care provider or pharmacist before using salt substitutes.  Do not fry foods. Cook foods using healthy methods such as baking, boiling, grilling, and broiling instead.  Cook with  heart-healthy oils, such as olive, canola, soybean, or sunflower oil. Meal planning  Eat a balanced diet that includes: ? 5 or more servings of fruits and vegetables each day. At each meal, try to fill half of your plate with fruits and vegetables. ? Up to 6-8 servings of whole grains each day. ? Less than 6 oz of lean meat, poultry, or fish each day. A 3-oz serving of meat is about the same size as a deck of cards. One egg equals 1 oz. ? 2 servings of low-fat dairy each day. ? A serving of nuts, seeds, or beans 5 times each week. ? Heart-healthy fats. Healthy fats called Omega-3 fatty acids are found in foods such as flaxseeds and coldwater fish, like sardines, salmon, and mackerel.  Limit how much you eat of the following: ? Canned or prepackaged foods. ? Food that is high in trans fat, such as fried foods. ? Food that is high in saturated fat, such as fatty meat. ? Sweets, desserts, sugary drinks, and other foods with added sugar. ? Full-fat dairy products.  Do not salt foods before eating.  Try to eat at least 2 vegetarian meals each week.  Eat more home-cooked food and less restaurant, buffet, and fast food.  When eating at a restaurant, ask that your food be prepared with less salt or no salt, if possible. What foods are recommended? The items listed may not be a complete list. Talk with your dietitian about   what dietary choices are best for you. Grains Whole-grain or whole-wheat bread. Whole-grain or whole-wheat pasta. Brown rice. Oatmeal. Quinoa. Bulgur. Whole-grain and low-sodium cereals. Pita bread. Low-fat, low-sodium crackers. Whole-wheat flour tortillas. Vegetables Fresh or frozen vegetables (raw, steamed, roasted, or grilled). Low-sodium or reduced-sodium tomato and vegetable juice. Low-sodium or reduced-sodium tomato sauce and tomato paste. Low-sodium or reduced-sodium canned vegetables. Fruits All fresh, dried, or frozen fruit. Canned fruit in natural juice (without  added sugar). Meat and other protein foods Skinless chicken or turkey. Ground chicken or turkey. Pork with fat trimmed off. Fish and seafood. Egg whites. Dried beans, peas, or lentils. Unsalted nuts, nut butters, and seeds. Unsalted canned beans. Lean cuts of beef with fat trimmed off. Low-sodium, lean deli meat. Dairy Low-fat (1%) or fat-free (skim) milk. Fat-free, low-fat, or reduced-fat cheeses. Nonfat, low-sodium ricotta or cottage cheese. Low-fat or nonfat yogurt. Low-fat, low-sodium cheese. Fats and oils Soft margarine without trans fats. Vegetable oil. Low-fat, reduced-fat, or light mayonnaise and salad dressings (reduced-sodium). Canola, safflower, olive, soybean, and sunflower oils. Avocado. Seasoning and other foods Herbs. Spices. Seasoning mixes without salt. Unsalted popcorn and pretzels. Fat-free sweets. What foods are not recommended? The items listed may not be a complete list. Talk with your dietitian about what dietary choices are best for you. Grains Baked goods made with fat, such as croissants, muffins, or some breads. Dry pasta or rice meal packs. Vegetables Creamed or fried vegetables. Vegetables in a cheese sauce. Regular canned vegetables (not low-sodium or reduced-sodium). Regular canned tomato sauce and paste (not low-sodium or reduced-sodium). Regular tomato and vegetable juice (not low-sodium or reduced-sodium). Pickles. Olives. Fruits Canned fruit in a light or heavy syrup. Fried fruit. Fruit in cream or butter sauce. Meat and other protein foods Fatty cuts of meat. Ribs. Fried meat. Bacon. Sausage. Bologna and other processed lunch meats. Salami. Fatback. Hotdogs. Bratwurst. Salted nuts and seeds. Canned beans with added salt. Canned or smoked fish. Whole eggs or egg yolks. Chicken or turkey with skin. Dairy Whole or 2% milk, cream, and half-and-half. Whole or full-fat cream cheese. Whole-fat or sweetened yogurt. Full-fat cheese. Nondairy creamers. Whipped toppings.  Processed cheese and cheese spreads. Fats and oils Butter. Stick margarine. Lard. Shortening. Ghee. Bacon fat. Tropical oils, such as coconut, palm kernel, or palm oil. Seasoning and other foods Salted popcorn and pretzels. Onion salt, garlic salt, seasoned salt, table salt, and sea salt. Worcestershire sauce. Tartar sauce. Barbecue sauce. Teriyaki sauce. Soy sauce, including reduced-sodium. Steak sauce. Canned and packaged gravies. Fish sauce. Oyster sauce. Cocktail sauce. Horseradish that you find on the shelf. Ketchup. Mustard. Meat flavorings and tenderizers. Bouillon cubes. Hot sauce and Tabasco sauce. Premade or packaged marinades. Premade or packaged taco seasonings. Relishes. Regular salad dressings. Where to find more information:  National Heart, Lung, and Blood Institute: www.nhlbi.nih.gov  American Heart Association: www.heart.org Summary  The DASH eating plan is a healthy eating plan that has been shown to reduce high blood pressure (hypertension). It may also reduce your risk for type 2 diabetes, heart disease, and stroke.  With the DASH eating plan, you should limit salt (sodium) intake to 2,300 mg a day. If you have hypertension, you may need to reduce your sodium intake to 1,500 mg a day.  When on the DASH eating plan, aim to eat more fresh fruits and vegetables, whole grains, lean proteins, low-fat dairy, and heart-healthy fats.  Work with your health care provider or diet and nutrition specialist (dietitian) to adjust your eating plan to your   individual calorie needs. This information is not intended to replace advice given to you by your health care provider. Make sure you discuss any questions you have with your health care provider. Document Revised: 11/21/2017 Document Reviewed: 12/02/2016 Elsevier Patient Education  2020 [employer]  

## 2020-08-23 NOTE — Assessment & Plan Note (Signed)
Chronic, ongoing.  BP at goal today.  Continue current medication regimen and adjust as needed.  Refills as needed.  Recommend obtaining BP cuff and checking BP three mornings a week at home + focus on DASH diet.  Obtain labs today.  Return in 6 months for physical.

## 2020-08-23 NOTE — Assessment & Plan Note (Signed)
Chronic, stable with TSH at goal for > 2 years.  Continue Levothyroxine at 150 MCG daily and adjust as needed.  Obtain labs at physical in 6 months.

## 2020-08-23 NOTE — Progress Notes (Signed)
BP 129/85   Pulse 68   Temp 97.7 F (36.5 C) (Oral)   Wt 219 lb (99.3 kg)   SpO2 98%   BMI 31.26 kg/m    Subjective:    Patient ID: George Hawkins, male    DOB: Sep 15, 1961, 59 y.o.   MRN: [id number]  HPI: George Hawkins is a 59 y.o. male  Chief Complaint  Patient presents with  . Hypertension  . Hyperlipidemia  . Hypothyroidism   HYPERTENSION / HYPERLIPIDEMIA Currently on Atenolol daily and Lipitor. March labs noted stable kidney function and LDL 77 with triglycerides 407. Satisfied with current treatment? yes Duration of hypertension: chronic BP monitoring frequency: not checking BP range:  BP medication side effects: no Duration of hyperlipidemia: chronic Cholesterol medication side effects: no Cholesterol supplements: fish oil  Medication compliance: good compliance Aspirin: yes Recent stressors: no Recurrent headaches: no Visual changes: no Palpitations: no Dyspnea: no Chest pain: no Lower extremity edema: no Dizzy/lightheaded: no   HYPOTHYROIDISM Currently taking Levothyroxine 150 MCG daily with last TSH March 3.060 and T4 7.6.  Thyroid control status:stable Satisfied with current treatment? yes Medication side effects: no Medication compliance: good compliance Etiology of hypothyroidism:  Recent dose adjustment:no Fatigue: no Cold intolerance: no Heat intolerance: no Weight gain: no Weight loss: no Constipation: no Diarrhea/loose stools: no Palpitations: no Lower extremity edema: no Anxiety/depressed mood: no   ERECTILE DYSFUNCTION Started about 1 1/2 years ago.  Can achieve erection, but has difficulty maintaining.  Started on Cialis last visit, offering benefit.  Denies any family history of MI or CVA.  Relevant past medical, surgical, family and social history reviewed and updated as indicated. Interim medical history since our last visit reviewed. Allergies and medications reviewed and updated.  Review of Systems    Constitutional: Negative for activity change, diaphoresis, fatigue and fever.  Respiratory: Negative for cough, chest tightness, shortness of breath and wheezing.   Cardiovascular: Negative for chest pain, palpitations and leg swelling.  Gastrointestinal: Negative.   Endocrine: Negative for cold intolerance and heat intolerance.  Neurological: Negative.   Psychiatric/Behavioral: Negative.     Per HPI unless specifically indicated above     Objective:    BP 129/85   Pulse 68   Temp 97.7 F (36.5 C) (Oral)   Wt 219 lb (99.3 kg)   SpO2 98%   BMI 31.26 kg/m   Wt Readings from Last 3 Encounters:  08/23/20 219 lb (99.3 kg)  02/21/20 214 lb 12.8 oz (97.4 kg)  02/18/19 212 lb (96.2 kg)    Physical Exam Vitals and nursing note reviewed.  Constitutional:      General: He is awake. He is not in acute distress.    Appearance: He is well-developed and well-groomed. He is obese. He is not ill-appearing.  HENT:     Head: Normocephalic and atraumatic.     Right Ear: Hearing normal. No drainage.     Left Ear: Hearing normal. No drainage.  Eyes:     General: Lids are normal.        Right eye: No discharge.        Left eye: No discharge.     Conjunctiva/sclera: Conjunctivae normal.     Pupils: Pupils are equal, round, and reactive to light.  Neck:     Thyroid: No thyromegaly.     Vascular: No carotid bruit.     Trachea: Trachea normal.  Cardiovascular:     Rate and Rhythm: Normal rate and regular rhythm.  Heart sounds: Normal heart sounds, S1 normal and S2 normal. No murmur heard.  No gallop.   Pulmonary:     Effort: Pulmonary effort is normal. No accessory muscle usage or respiratory distress.     Breath sounds: Normal breath sounds.  Abdominal:     General: Bowel sounds are normal.     Palpations: Abdomen is soft.  Musculoskeletal:        General: Normal range of motion.     Cervical back: Normal range of motion and neck supple.     Right lower leg: No edema.     Left  lower leg: No edema.  Lymphadenopathy:     Cervical: No cervical adenopathy.  Skin:    General: Skin is warm and dry.     Capillary Refill: Capillary refill takes less than 2 seconds.  Neurological:     Mental Status: He is alert and oriented to person, place, and time.  Psychiatric:        Attention and Perception: Attention normal.        Mood and Affect: Mood normal.        Speech: Speech normal.        Behavior: Behavior normal. Behavior is cooperative.        Thought Content: Thought content normal.     Results for orders placed or performed in visit on 02/21/20  CBC with Differential/Platelet  Result Value Ref Range   WBC 5.0 3.4 - 10.8 x10E3/uL   RBC 4.22 4.14 - 5.80 x10E6/uL   Hemoglobin 13.9 13.0 - 17.7 g/dL   Hematocrit 39.9 37.5 - 51.0 %   MCV 95 79 - 97 fL   MCH 32.9 26.6 - 33.0 pg   MCHC 34.8 31 - 35 g/dL   RDW 12.1 11.6 - 15.4 %   Platelets 155 150 - 450 x10E3/uL   Neutrophils 66 Not Estab. %   Lymphs 18 Not Estab. %   Monocytes 9 Not Estab. %   Eos 5 Not Estab. %   Basos 1 Not Estab. %   Neutrophils Absolute 3.3 1 - 7 x10E3/uL   Lymphocytes Absolute 0.9 0 - 3 x10E3/uL   Monocytes Absolute 0.4 0 - 0 x10E3/uL   EOS (ABSOLUTE) 0.3 0.0 - 0.4 x10E3/uL   Basophils Absolute 0.0 0 - 0 x10E3/uL   Immature Granulocytes 1 Not Estab. %   Immature Grans (Abs) 0.0 0.0 - 0.1 x10E3/uL  Comprehensive metabolic panel  Result Value Ref Range   Glucose 103 (H) 65 - 99 mg/dL   BUN 15 6 - 24 mg/dL   Creatinine, Ser 1.06 0.76 - 1.27 mg/dL   GFR calc non Af Amer 78 >59 mL/min/1.73   GFR calc Af Amer 90 >59 mL/min/1.73   BUN/Creatinine Ratio 14 9 - 20   Sodium 136 134 - 144 mmol/L   Potassium 4.1 3.5 - 5.2 mmol/L   Chloride 102 96 - 106 mmol/L   CO2 19 (L) 20 - 29 mmol/L   Calcium 9.6 8.7 - 10.2 mg/dL   Total Protein 6.9 6.0 - 8.5 g/dL   Albumin 4.7 3.8 - 4.9 g/dL   Globulin, Total 2.2 1.5 - 4.5 g/dL   Albumin/Globulin Ratio 2.1 1.2 - 2.2   Bilirubin Total 0.7 0.0 -  1.2 mg/dL   Alkaline Phosphatase 101 39 - 117 IU/L   AST 26 0 - 40 IU/L   ALT 33 0 - 44 IU/L  Lipid Panel w/o Chol/HDL Ratio  Result Value Ref Range  Cholesterol, Total 187 100 - 199 mg/dL   Triglycerides 407 (H) 0 - 149 mg/dL   HDL 45 >39 mg/dL   VLDL Cholesterol Cal 65 (H) 5 - 40 mg/dL   LDL Chol Calc (NIH) 77 0 - 99 mg/dL  PSA  Result Value Ref Range   Prostate Specific Ag, Serum 0.3 0.0 - 4.0 ng/mL  Thyroid Panel With TSH  Result Value Ref Range   TSH 3.060 0.450 - 4.500 uIU/mL   T4, Total 7.6 4.5 - 12.0 ug/dL   T3 Uptake Ratio 24 24 - 39 %   Free Thyroxine Index 1.8 1.2 - 4.9  Uric acid  Result Value Ref Range   Uric Acid 10.9 (H) 3.8 - 8.4 mg/dL      Assessment & Plan:   Problem List Items Addressed This Visit      Cardiovascular and Mediastinum   Hypertension - Primary    Chronic, ongoing.  BP at goal today.  Continue current medication regimen and adjust as needed.  Refills as needed.  Recommend obtaining BP cuff and checking BP three mornings a week at home + focus on DASH diet.  Obtain labs today.  Return in 6 months for physical.      Relevant Medications   tadalafil (CIALIS) 10 MG tablet   Other Relevant Orders   Basic metabolic panel     Endocrine   Hypothyroidism    Chronic, stable with TSH at goal for > 2 years.  Continue Levothyroxine at 150 MCG daily and adjust as needed.  Obtain labs at physical in 6 months.        Other   Hyperlipidemia    Chronic, ongoing with last LDL <100.  Continue current medication regimen and adjust as needed.  Lipid panel today, consider addition of Fenofibrate if TRIGS remain elevated or increase Lipitor to 20 MG daily -- he is taking Fish Oil daily.  Return in 6 months for annual physical.      Relevant Medications   tadalafil (CIALIS) 10 MG tablet   Other Relevant Orders   Lipid Panel w/o Chol/HDL Ratio   Obesity    BMI 31.26.  Recommended eating smaller high protein, low fat meals more frequently and exercising 30  mins a day 5 times a week with a goal of 10-15lb weight loss in the next 3 months. Patient voiced their understanding and motivation to adhere to these recommendations.       Erectile dysfunction    Stable with addition of Cialis.  Will send in refills and adjust regimen as needed.          Follow up plan: Return in about 6 months (around 02/20/2021) for Annual physical.

## 2020-08-23 NOTE — Assessment & Plan Note (Signed)
Stable with addition of Cialis.  Will send in refills and adjust regimen as needed. 

## 2020-08-23 NOTE — Assessment & Plan Note (Addendum)
Chronic, ongoing with last LDL <100.  Continue current medication regimen and adjust as needed.  Lipid panel today, consider addition of Fenofibrate if TRIGS remain elevated or increase Lipitor to 20 MG daily -- he is taking Fish Oil daily.  Return in 6 months for annual physical.

## 2020-08-24 LAB — BASIC METABOLIC PANEL
BUN/Creatinine Ratio: 17 (ref 9–20)
BUN: 17 mg/dL (ref 6–24)
CO2: 21 mmol/L (ref 20–29)
Calcium: 9.6 mg/dL (ref 8.7–10.2)
Chloride: 100 mmol/L (ref 96–106)
Creatinine, Ser: 1.03 mg/dL (ref 0.76–1.27)
GFR calc Af Amer: 93 mL/min/{1.73_m2} (ref >59)
GFR calc non Af Amer: 80 mL/min/{1.73_m2} (ref >59)
Glucose: 101 mg/dL — ABNORMAL HIGH (ref 65–99)
Potassium: 4.3 mmol/L (ref 3.5–5.2)
Sodium: 137 mmol/L (ref 134–144)

## 2020-08-24 LAB — LIPID PANEL W/O CHOL/HDL RATIO
Cholesterol, Total: 191 mg/dL (ref 100–199)
HDL: 47 mg/dL (ref >39)
LDL Chol Calc (NIH): 97 mg/dL (ref 0–99)
Triglycerides: 283 mg/dL — ABNORMAL HIGH (ref 0–149)
VLDL Cholesterol Cal: 47 mg/dL — ABNORMAL HIGH (ref 5–40)

## 2020-08-24 NOTE — Progress Notes (Signed)
Contacted via [place] morning [place], your labs have returned and overall continue to look good with exception of ongoing elevation in triglycerides.  It would be beneficial to increase your Atorvastatin to 20 MG daily.  This may help lower triglycerides and get your LDL bellow goal of <70 for stroke prevention.  Would you like to try this increase?  Let me know.   Keep being awesome!!  Thank you for allowing me to participate in your care. Kindest regards, Charlena Haub

## 2020-10-02 MED FILL — LEVOTHYROXINE SODIUM 150 MC: 150 | 90 days supply | Qty: 90 | Fill #2

## 2020-10-31 MED FILL — ATORVASTATIN CALCIUM 10 MG: 10 | 90 days supply | Qty: 90 | Fill #2

## 2020-11-27 DIAGNOSIS — H524 Presbyopia: Secondary | ICD-10-CM | POA: Diagnosis not present

## 2020-11-30 MED FILL — ATENOLOL 100 MG TAB: 100 | 90 days supply | Qty: 90 | Fill #3

## 2021-01-12 MED FILL — LEVOTHYROXINE SODIUM 150 MC: 150 | 90 days supply | Qty: 90 | Fill #3

## 2021-01-18 ENCOUNTER — Other Ambulatory Visit: Payer: Self-pay | Admitting: Nurse Practitioner

## 2021-01-18 DIAGNOSIS — M1A9XX0 Chronic gout, unspecified, without tophus (tophi): Secondary | ICD-10-CM

## 2021-01-18 MED FILL — INDOMETHACIN 50 MG CAPSULE: 50 | 30 days supply | Qty: 90 | Fill #0

## 2021-02-02 MED FILL — ATORVASTATIN CALCIUM 10 MG: 10 | 90 days supply | Qty: 90 | Fill #3

## 2021-02-20 ENCOUNTER — Ambulatory Visit (INDEPENDENT_AMBULATORY_CARE_PROVIDER_SITE_OTHER): Payer: 59 | Admitting: Nurse Practitioner

## 2021-02-20 ENCOUNTER — Encounter: Payer: Self-pay | Admitting: Nurse Practitioner

## 2021-02-20 ENCOUNTER — Other Ambulatory Visit: Payer: Self-pay | Admitting: Nurse Practitioner

## 2021-02-20 ENCOUNTER — Other Ambulatory Visit: Payer: Self-pay

## 2021-02-20 VITALS — BP 136/78 | HR 68 | Temp 97.8°F | Ht 70.59 in | Wt 219.4 lb

## 2021-02-20 DIAGNOSIS — E781 Pure hyperglyceridemia: Secondary | ICD-10-CM

## 2021-02-20 DIAGNOSIS — E6609 Other obesity due to excess calories: Secondary | ICD-10-CM

## 2021-02-20 DIAGNOSIS — Z6830 Body mass index (BMI) 30.0-30.9, adult: Secondary | ICD-10-CM

## 2021-02-20 DIAGNOSIS — I10 Essential (primary) hypertension: Secondary | ICD-10-CM | POA: Diagnosis not present

## 2021-02-20 DIAGNOSIS — M1A9XX0 Chronic gout, unspecified, without tophus (tophi): Secondary | ICD-10-CM | POA: Diagnosis not present

## 2021-02-20 DIAGNOSIS — N522 Drug-induced erectile dysfunction: Secondary | ICD-10-CM

## 2021-02-20 DIAGNOSIS — Z0000 Encounter for general adult medical examination without abnormal findings: Secondary | ICD-10-CM | POA: Diagnosis not present

## 2021-02-20 DIAGNOSIS — E039 Hypothyroidism, unspecified: Secondary | ICD-10-CM

## 2021-02-20 DIAGNOSIS — Z125 Encounter for screening for malignant neoplasm of prostate: Secondary | ICD-10-CM

## 2021-02-20 MED ORDER — ATORVASTATIN CALCIUM 10 MG PO TABS: 10.0000 mg | ORAL_TABLET | Freq: Every day | ORAL | 4 refills | Status: DC

## 2021-02-20 MED ORDER — ATENOLOL 100 MG PO TABS: 100.0000 mg | ORAL_TABLET | Freq: Every day | ORAL | 4 refills | Status: DC

## 2021-02-20 MED ORDER — LEVOTHYROXINE SODIUM 150 MCG PO TABS: 150.0000 ug | ORAL_TABLET | Freq: Every day | ORAL | 4 refills | Status: DC

## 2021-02-20 MED FILL — ATENOLOL 100 MG TAB: 100 | 90 days supply | Qty: 90 | Fill #0

## 2021-02-20 NOTE — Assessment & Plan Note (Signed)
Chronic, ongoing.  BP at goal today on recheck, very mild elevation but has not taken medication today.  Continue current medication regimen and adjust as needed.  Refills sent in.  Recommend obtaining BP cuff and checking BP three mornings a week at home + focus on DASH diet.  Obtain labs today: CBC, CMP, TSH.  Return in 6 months for follow-up.

## 2021-02-20 NOTE — Patient Instructions (Signed)
Healthy Eating Following a healthy eating pattern may help you to achieve and maintain a healthy body weight, reduce the risk of chronic disease, and live a long and productive life. It is important to follow a healthy eating pattern at an appropriate calorie level for your body. Your nutritional needs should be met primarily through food by choosing a variety of nutrient-rich foods. What are tips for following this plan? Reading food labels  Read labels and choose the following: ? Reduced or low sodium. ? Juices with 100% fruit juice. ? Foods with low saturated fats and high polyunsaturated and monounsaturated fats. ? Foods with whole grains, such as whole wheat, cracked wheat, brown rice, and wild rice. ? Whole grains that are fortified with folic acid. This is recommended for women who are pregnant or who want to become pregnant.  Read labels and avoid the following: ? Foods with a lot of added sugars. These include foods that contain brown sugar, corn sweetener, corn syrup, dextrose, fructose, glucose, high-fructose corn syrup, honey, invert sugar, lactose, malt syrup, maltose, molasses, raw sugar, sucrose, trehalose, or turbinado sugar.  Do not eat more than the following amounts of added sugar per day:  6 teaspoons (25 g) for women.  9 teaspoons (38 g) for men. ? Foods that contain processed or refined starches and grains. ? Refined grain products, such as white flour, degermed cornmeal, white bread, and white rice. Shopping  Choose nutrient-rich snacks, such as vegetables, whole fruits, and nuts. Avoid high-calorie and high-sugar snacks, such as potato chips, fruit snacks, and candy.  Use oil-based dressings and spreads on foods instead of solid fats such as butter, stick margarine, or cream cheese.  Limit pre-made sauces, mixes, and "instant" products such as flavored rice, instant noodles, and ready-made pasta.  Try more plant-protein sources, such as tofu, tempeh, black beans,  edamame, lentils, nuts, and seeds.  Explore eating plans such as the Mediterranean diet or vegetarian diet. Cooking  Use oil to saut or stir-fry foods instead of solid fats such as butter, stick margarine, or lard.  Try baking, boiling, grilling, or broiling instead of frying.  Remove the fatty part of meats before cooking.  Steam vegetables in water or broth. Meal planning  At meals, imagine dividing your plate into fourths: ? One-half of your plate is fruits and vegetables. ? One-fourth of your plate is whole grains. ? One-fourth of your plate is protein, especially lean meats, poultry, eggs, tofu, beans, or nuts.  Include low-fat dairy as part of your daily diet.   Lifestyle  Choose healthy options in all settings, including home, work, school, restaurants, or stores.  Prepare your food safely: ? Wash your hands after handling raw meats. ? Keep food preparation surfaces clean by regularly washing with hot, soapy water. ? Keep raw meats separate from ready-to-eat foods, such as fruits and vegetables. ? Cook seafood, meat, poultry, and eggs to the recommended internal temperature. ? Store foods at safe temperatures. In general:  Keep cold foods at 7F (4.4C) or below.  Keep hot foods at 17F (60C) or above.  Keep your freezer at [facility] (-17.8C) or below.  Foods are no longer safe to eat when they have been between the temperatures of 40-17F (4.4-60C) for more than 2 hours. What foods should I eat? Fruits Aim to eat 2 cup-equivalents of fresh, canned (in natural juice), or frozen fruits each day. Examples of 1 cup-equivalent of fruit include 1 small apple, 8 large strawberries, 1 cup canned fruit,  cup dried fruit, or 1 cup 100% juice. Vegetables Aim to eat 2-3 cup-equivalents of fresh and frozen vegetables each day, including different varieties and colors. Examples of 1 cup-equivalent of vegetables include 2 medium carrots, 2 cups raw, leafy greens, 1 cup chopped  vegetable (raw or cooked), or 1 medium baked potato. Grains Aim to eat 6 ounce-equivalents of whole grains each day. Examples of 1 ounce-equivalent of grains include 1 slice of bread, 1 cup ready-to-eat cereal, 3 cups popcorn, or  cup cooked rice, pasta, or cereal. Meats and other proteins Aim to eat 5-6 ounce-equivalents of protein each day. Examples of 1 ounce-equivalent of protein include 1 egg, 1/2 cup nuts or seeds, or 1 tablespoon (16 g) peanut butter. A cut of meat or fish that is the size of a deck of cards is about 3-4 ounce-equivalents.  Of the protein you eat each week, try to have at least 8 ounces come from seafood. This includes salmon, trout, herring, and anchovies. Dairy Aim to eat 3 cup-equivalents of fat-free or low-fat dairy each day. Examples of 1 cup-equivalent of dairy include 1 cup (240 mL) milk, 8 ounces (250 g) yogurt, 1 ounces (44 g) natural cheese, or 1 cup (240 mL) fortified soy milk. Fats and oils  Aim for about 5 teaspoons (21 g) per day. Choose monounsaturated fats, such as canola and olive oils, avocados, peanut butter, and most nuts, or polyunsaturated fats, such as sunflower, corn, and soybean oils, walnuts, pine nuts, sesame seeds, sunflower seeds, and flaxseed. Beverages  Aim for six 8-oz glasses of water per day. Limit coffee to three to five 8-oz cups per day.  Limit caffeinated beverages that have added calories, such as soda and energy drinks.  Limit alcohol intake to no more than 1 drink a day for nonpregnant women and 2 drinks a day for men. One drink equals 12 oz of beer (355 mL), 5 oz of wine (148 mL), or 1 oz of hard liquor (44 mL). Seasoning and other foods  Avoid adding excess amounts of salt to your foods. Try flavoring foods with herbs and spices instead of salt.  Avoid adding sugar to foods.  Try using oil-based dressings, sauces, and spreads instead of solid fats. This information is based on general [place] nutrition guidelines. For more  information, visit choosemyplate.gov. Exact amounts may vary based on your nutrition needs. Summary  A healthy eating plan may help you to maintain a healthy weight, reduce the risk of chronic diseases, and stay active throughout your life.  Plan your meals. Make sure you eat the right portions of a variety of nutrient-rich foods.  Try baking, boiling, grilling, or broiling instead of frying.  Choose healthy options in all settings, including home, work, school, restaurants, or stores. This information is not intended to replace advice given to you by your health care provider. Make sure you discuss any questions you have with your health care provider. Document Revised: 03/23/2018 Document Reviewed: 03/23/2018 Elsevier Patient Education  2021 [employer] Inc.  

## 2021-02-20 NOTE — Assessment & Plan Note (Signed)
Chronic, stable with no maintenance medications.  Continue Indocin as needed only.  No flare in > 3 years.  Uric acid level as needed.

## 2021-02-20 NOTE — Assessment & Plan Note (Signed)
Chronic, ongoing with last LDL <100.  Continue current medication regimen and adjust as needed.  Lipid panel today, consider addition of Fenofibrate if TRIGS remain elevated or increase Lipitor to 20 MG daily -- he is taking Fish Oil daily.  Return in 6 months for follow-up.

## 2021-02-20 NOTE — Assessment & Plan Note (Signed)
Chronic, stable with TSH at goal for > 2 years.  Continue Levothyroxine at 150 MCG daily and adjust as needed.  Refills sent in.  Obtain labs today.  Return in 6 months for follow-up.

## 2021-02-20 NOTE — Assessment & Plan Note (Signed)
Stable with addition of Cialis.  Will send in refills and adjust regimen as needed. 

## 2021-02-20 NOTE — Progress Notes (Signed)
BP 136/78 (BP Location: Left Arm)    Pulse 68    Temp 97.8 F (36.6 C) (Oral)    Ht 5' 10.59" (1.793 m)    Wt 219 lb 6.4 oz (99.5 kg)    SpO2 97%    BMI 30.96 kg/m    Subjective:    Patient ID: George Hawkins, male    DOB: 1961/05/05, 60 y.o.   MRN: [id number]  HPI: George Hawkins is a 60 y.o. male presenting on 02/20/2021 for comprehensive medical examination. Current medical complaints include:none  He currently lives with: wife Interim Problems from his last visit: no   HYPERTENSION / HYPERLIPIDEMIA Currently on Atenolol daily and Lipitor.  Has not taken medication this morning.  He has a history of gout, but has not had flare in over 4 years.  No current chronic gout medications, has Indocin script he can use as needed. Satisfied with current treatment? yes Duration of hypertension: chronic BP monitoring frequency: not checking BP range:  BP medication side effects: no Duration of hyperlipidemia: chronic Cholesterol medication side effects: no Cholesterol supplements: fish oil  Medication compliance: good compliance Aspirin: yes Recent stressors: no Recurrent headaches: no Visual changes: no Palpitations: no Dyspnea: no Chest pain: no Lower extremity edema: no Dizzy/lightheaded: no  The 10-year ASCVD risk score Mikey Bussing DC Jr., et al., 2013) is: 8.7%   Values used to calculate the score:     Age: 71 years     Sex: Male     Is Non-Hispanic African American: No     Diabetic: No     Tobacco smoker: No     Systolic Blood Pressure: 237 mmHg     Is BP treated: Yes     HDL Cholesterol: 47 mg/dL     Total Cholesterol: 191 mg/dL    HYPOTHYROIDISM Currently taking Levothyroxine 150 MCG daily with last TSH February 3.060 and T4 7.6.  Thyroid control status:stable Satisfied with current treatment? yes Medication side effects: no Medication compliance: good compliance Etiology of hypothyroidism:  Recent dose adjustment:no Fatigue: no Cold intolerance: no Heat  intolerance: no Weight gain: no Weight loss: no Constipation: no Diarrhea/loose stools: no Palpitations: no Lower extremity edema: no Anxiety/depressed mood: no   ERECTILE DYSFUNCTION Started about 1 1/2 years ago.  Can achieve erection, but has difficulty maintaining.  Taking Cialis without ADR.  Denies any family history of MI or CVA.  Functional Status Survey: Is the patient deaf or have difficulty hearing?: No Does the patient have difficulty seeing, even when wearing glasses/contacts?: No Does the patient have difficulty concentrating, remembering, or making decisions?: No Does the patient have difficulty walking or climbing stairs?: No Does the patient have difficulty dressing or bathing?: No  FALL RISK: Fall Risk  02/20/2021 02/18/2019 02/09/2018 11/04/2016  Falls in the past year? 0 0 No No  Injury with Fall? 0 - - -  Follow up - Falls evaluation completed - -    Depression Screen Depression screen [facility] 2/9 02/20/2021 02/21/2020 02/18/2019 02/09/2018 11/04/2016  Decreased Interest 0 0 0 0 0  Down, Depressed, Hopeless 0 0 0 0 0  PHQ - 2 Score 0 0 0 0 0  Altered sleeping 0 0 0 0 -  Tired, decreased energy 0 0 0 0 -  Change in appetite 0 0 0 0 -  Feeling bad or failure about yourself  0 0 0 0 -  Trouble concentrating 0 0 0 0 -  Moving slowly or fidgety/restless 0 0 0  0 -  Suicidal thoughts 0 0 0 0 -  PHQ-9 Score 0 0 0 0 -  Difficult doing work/chores - Not difficult at all Not difficult at all - -    Past Medical History:  Past Medical History:  Diagnosis Date   Gout    Hyperlipidemia    Hypertension    Hypothyroidism     Surgical History:  Past Surgical History:  Procedure Laterality Date   TENDON REPAIR Right    Hand   VASECTOMY      Medications:  Current Outpatient Medications on File Prior to Visit  Medication Sig   aspirin 81 MG chewable tablet Chew 81 mg by mouth daily.   Fish Oil OIL by Does not apply route.   indomethacin (INDOCIN) 50 MG  capsule TAKE ONE CAPSULE BY MOUTH THREE TIMES DAILY AS NEEDED   tadalafil (CIALIS) 10 MG tablet Take 1 tablet (10 mg total) by mouth daily as needed for erectile dysfunction. Take 30 minutes prior to anticipated sexual intercourse.  Do not take more than one dose daily.   No current facility-administered medications on file prior to visit.    Allergies:  No Known Allergies  Social History:  Social History   Socioeconomic History   Marital status: Married    Spouse name: Not on file   Number of children: Not on file   Years of education: Not on file   Highest education level: Not on file  Occupational History   Not on file  Tobacco Use   Smoking status: Never Smoker   Smokeless tobacco: Never Used  Vaping Use   Vaping Use: Never used  Substance and Sexual Activity   Alcohol use: Yes    Alcohol/week: 14.0 standard drinks    Types: 14 Cans of beer per week   Drug use: No   Sexual activity: Yes  Other Topics Concern   Not on file  Social History Narrative   Not on file   Social Determinants of Health   Financial Resource Strain: Not on file  Food Insecurity: Not on file  Transportation Needs: Not on file  Physical Activity: Not on file  Stress: Not on file  Social Connections: Not on file  Intimate Partner Violence: Not on file   Social History   Tobacco Use  Smoking Status Never Smoker  Smokeless Tobacco Never Used   Social History   Substance and Sexual Activity  Alcohol Use Yes   Alcohol/week: 14.0 standard drinks   Types: 14 Cans of beer per week    Family History:  Family History  Problem Relation Age of Onset   Thyroid disease Brother    Thyroid disease Sister    Thyroid disease Brother    Heart attack Neg Hx    Hyperlipidemia Neg Hx    Hypertension Neg Hx    Sudden death Neg Hx     Past medical history, surgical history, medications, allergies, family history and social history reviewed with patient today and changes  made to appropriate areas of the chart.   Review of Systems - Negative All other ROS negative except what is listed above and in the HPI.      Objective:    BP 136/78 (BP Location: Left Arm)    Pulse 68    Temp 97.8 F (36.6 C) (Oral)    Ht 5' 10.59" (1.793 m)    Wt 219 lb 6.4 oz (99.5 kg)    SpO2 97%    BMI 30.96 kg/m  Wt Readings from Last 3 Encounters:  02/20/21 219 lb 6.4 oz (99.5 kg)  08/23/20 219 lb (99.3 kg)  02/21/20 214 lb 12.8 oz (97.4 kg)    Physical Exam Vitals and nursing note reviewed.  Constitutional:      General: He is awake.     Appearance: He is well-developed.  HENT:     Head: Normocephalic and atraumatic.     Right Ear: Hearing, tympanic membrane, ear canal and external ear normal. No decreased hearing noted. No drainage.     Left Ear: Hearing, tympanic membrane, ear canal and external ear normal. No decreased hearing noted. No drainage.     Nose: Nose normal.     Right Sinus: No maxillary sinus tenderness or frontal sinus tenderness.     Left Sinus: No maxillary sinus tenderness or frontal sinus tenderness.     Mouth/Throat:     Pharynx: Uvula midline.  Eyes:     General: Lids are normal.        Right eye: No discharge.        Left eye: No discharge.     Conjunctiva/sclera: Conjunctivae normal.     Pupils: Pupils are equal, round, and reactive to light.     Visual Fields: Right eye visual fields normal and left eye visual fields normal.  Neck:     Thyroid: No thyromegaly.     Vascular: No carotid bruit or JVD.     Trachea: Trachea normal.  Cardiovascular:     Rate and Rhythm: Normal rate and regular rhythm.     Heart sounds: Normal heart sounds, S1 normal and S2 normal. No murmur heard. No gallop.   Pulmonary:     Effort: Pulmonary effort is normal.     Breath sounds: Normal breath sounds.  Abdominal:     General: Bowel sounds are normal.     Palpations: Abdomen is soft. There is no hepatomegaly or splenomegaly.  Genitourinary:    Penis:  Normal.      Rectum: Normal.  Musculoskeletal:        General: Normal range of motion.     Cervical back: Normal range of motion and neck supple.     Right lower leg: No edema.     Left lower leg: No edema.  Lymphadenopathy:     Cervical: No cervical adenopathy.  Skin:    General: Skin is warm and dry.     Capillary Refill: Capillary refill takes less than 2 seconds.     Findings: No rash.  Neurological:     Mental Status: He is alert and oriented to person, place, and time.     Cranial Nerves: Cranial nerves are intact.     Sensory: Sensation is intact.     Motor: Motor function is intact.     Coordination: Coordination is intact.     Gait: Gait is intact.     Deep Tendon Reflexes: Reflexes are normal and symmetric.     Reflex Scores:      Brachioradialis reflexes are 2+ on the right side and 2+ on the left side.      Patellar reflexes are 2+ on the right side and 2+ on the left side. Psychiatric:        Attention and Perception: Attention normal.        Mood and Affect: Mood normal.        Speech: Speech normal.        Behavior: Behavior normal. Behavior is cooperative.  Thought Content: Thought content normal.        Cognition and Memory: Cognition normal.        Judgment: Judgment normal.    Results for orders placed or performed in visit on 50/35/46  Basic metabolic panel  Result Value Ref Range   Glucose 101 (H) 65 - 99 mg/dL   BUN 17 6 - 24 mg/dL   Creatinine, Ser 1.03 0.76 - 1.27 mg/dL   GFR calc non Af Amer 80 >59 mL/min/1.73   GFR calc Af Amer 93 >59 mL/min/1.73   BUN/Creatinine Ratio 17 9 - 20   Sodium 137 134 - 144 mmol/L   Potassium 4.3 3.5 - 5.2 mmol/L   Chloride 100 96 - 106 mmol/L   CO2 21 20 - 29 mmol/L   Calcium 9.6 8.7 - 10.2 mg/dL  Lipid Panel w/o Chol/HDL Ratio  Result Value Ref Range   Cholesterol, Total 191 100 - 199 mg/dL   Triglycerides 283 (H) 0 - 149 mg/dL   HDL 47 >39 mg/dL   VLDL Cholesterol Cal 47 (H) 5 - 40 mg/dL   LDL Chol  Calc (NIH) 97 0 - 99 mg/dL      Assessment & Plan:   Problem List Items Addressed This Visit      Cardiovascular and Mediastinum   Hypertension - Primary    Chronic, ongoing.  BP at goal today on recheck, very mild elevation but has not taken medication today.  Continue current medication regimen and adjust as needed.  Refills sent in.  Recommend obtaining BP cuff and checking BP three mornings a week at home + focus on DASH diet.  Obtain labs today: CBC, CMP, TSH.  Return in 6 months for follow-up.      Relevant Medications   atenolol (TENORMIN) 100 MG tablet   atorvastatin (LIPITOR) 10 MG tablet   Other Relevant Orders   CBC with Differential/Platelet   Comprehensive metabolic panel   Lipid Panel w/o Chol/HDL Ratio     Endocrine   Hypothyroidism    Chronic, stable with TSH at goal for > 2 years.  Continue Levothyroxine at 150 MCG daily and adjust as needed.  Refills sent in.  Obtain labs today.  Return in 6 months for follow-up.      Relevant Medications   atenolol (TENORMIN) 100 MG tablet   levothyroxine (SYNTHROID) 150 MCG tablet   Other Relevant Orders   TSH   T4, free     Other   Hyperlipidemia    Chronic, ongoing with last LDL <100.  Continue current medication regimen and adjust as needed.  Lipid panel today, consider addition of Fenofibrate if TRIGS remain elevated or increase Lipitor to 20 MG daily -- he is taking Fish Oil daily.  Return in 6 months for follow-up.      Relevant Medications   atenolol (TENORMIN) 100 MG tablet   atorvastatin (LIPITOR) 10 MG tablet   Other Relevant Orders   Comprehensive metabolic panel   Lipid Panel w/o Chol/HDL Ratio   Chronic gout    Chronic, stable with no maintenance medications.  Continue Indocin as needed only.  No flare in > 3 years.  Uric acid level as needed.      Obesity    BMI 30.96.  Recommended eating smaller high protein, low fat meals more frequently and exercising 30 mins a day 5 times a week with a goal of  10-15lb weight loss in the next 3 months. Patient voiced their understanding and motivation to  adhere to these recommendations.       Erectile dysfunction    Stable with addition of Cialis.  Will send in refills and adjust regimen as needed.       Other Visit Diagnoses    Prostate cancer screening       PSA check on labs today.   Relevant Orders   PSA   Annual physical exam       Annual labs performed today to include CBC, CMP, TSH, lipid, and PSA.  Health maintenance reviewed, refuses shingles or flu vaccines.      Discussed aspirin prophylaxis for myocardial infarction prevention and decision was made to continue ASA  LABORATORY TESTING:  Health maintenance labs ordered today as discussed above.   The natural history of prostate cancer and ongoing controversy regarding screening and potential treatment outcomes of prostate cancer has been discussed with the patient. The meaning of a false positive PSA and a false negative PSA has been discussed. He indicates understanding of the limitations of this screening test and wishes to proceed with screening PSA testing.   IMMUNIZATIONS:   - Tdap: Tetanus vaccination status reviewed: up to date - Influenza: Refused - Pneumovax: Not applicable - Prevnar: Not applicable - Zostavax vaccine: Refused  - Covid vaccines x 2 obtained  SCREENING: - Colonoscopy: Up to date  Discussed with patient purpose of the colonoscopy is to detect colon cancer at curable precancerous or early stages   - AAA Screening: Not applicable  -Hearing Test: Not applicable  -Spirometry: Not applicable   PATIENT COUNSELING:    Sexuality: Discussed sexually transmitted diseases, partner selection, use of condoms, avoidance of unintended pregnancy  and contraceptive alternatives.   Advised to avoid cigarette smoking.  I discussed with the patient that most people either abstain from alcohol or drink within safe limits (<=14/week and <=4 drinks/occasion for  males, <=7/weeks and <= 3 drinks/occasion for females) and that the risk for alcohol disorders and other health effects rises proportionally with the number of drinks per week and how often a drinker exceeds daily limits.  Discussed cessation/primary prevention of drug use and availability of treatment for abuse.   Diet: Encouraged to adjust caloric intake to maintain  or achieve ideal body weight, to reduce intake of dietary saturated fat and total fat, to limit sodium intake by avoiding high sodium foods and not adding table salt, and to maintain adequate dietary potassium and calcium preferably from fresh fruits, vegetables, and low-fat dairy products.    Stressed the importance of regular exercise  Injury prevention: Discussed safety belts, safety helmets, smoke detector, smoking near bedding or upholstery.   Dental health: Discussed importance of regular tooth brushing, flossing, and dental visits.   Follow up plan: NEXT PREVENTATIVE PHYSICAL DUE IN 1 YEAR. Return in about 6 months (around 08/23/2021) for Hypothyroid, HTN/HLD.

## 2021-02-20 NOTE — Assessment & Plan Note (Signed)
BMI 30.96. Recommended eating smaller high protein, low fat meals more frequently and exercising 30 mins a day 5 times a week with a goal of 10-15lb weight loss in the next 3 months. Patient voiced their understanding and motivation to adhere to these recommendations.  

## 2021-02-21 LAB — CBC WITH DIFFERENTIAL/PLATELET
Basophils Absolute: 0.1 10*3/uL (ref 0.0–0.2)
Basos: 1 %
EOS (ABSOLUTE): 0.3 10*3/uL (ref 0.0–0.4)
Eos: 6 %
Hematocrit: 41.5 % (ref 37.5–51.0)
Hemoglobin: 14.4 g/dL (ref 13.0–17.7)
Immature Grans (Abs): 0 10*3/uL (ref 0.0–0.1)
Immature Granulocytes: 0 %
Lymphocytes Absolute: 0.8 10*3/uL (ref 0.7–3.1)
Lymphs: 16 %
MCH: 33 pg (ref 26.6–33.0)
MCHC: 34.7 g/dL (ref 31.5–35.7)
MCV: 95 fL (ref 79–97)
Monocytes Absolute: 0.5 10*3/uL (ref 0.1–0.9)
Monocytes: 9 %
Neutrophils Absolute: 3.4 10*3/uL (ref 1.4–7.0)
Neutrophils: 68 %
Platelets: 154 10*3/uL (ref 150–450)
RBC: 4.37 x10E6/uL (ref 4.14–5.80)
RDW: 11.8 % (ref 11.6–15.4)
WBC: 5.1 10*3/uL (ref 3.4–10.8)

## 2021-02-21 LAB — COMPREHENSIVE METABOLIC PANEL
ALT: 39 IU/L (ref 0–44)
AST: 29 IU/L (ref 0–40)
Albumin/Globulin Ratio: 2.1 (ref 1.2–2.2)
Albumin: 4.7 g/dL (ref 3.8–4.9)
Alkaline Phosphatase: 85 IU/L (ref 44–121)
BUN/Creatinine Ratio: 15 (ref 9–20)
BUN: 16 mg/dL (ref 6–24)
Bilirubin Total: 0.7 mg/dL (ref 0.0–1.2)
CO2: 19 mmol/L — ABNORMAL LOW (ref 20–29)
Calcium: 9.2 mg/dL (ref 8.7–10.2)
Chloride: 102 mmol/L (ref 96–106)
Creatinine, Ser: 1.05 mg/dL (ref 0.76–1.27)
Globulin, Total: 2.2 g/dL (ref 1.5–4.5)
Glucose: 105 mg/dL — ABNORMAL HIGH (ref 65–99)
Potassium: 4.1 mmol/L (ref 3.5–5.2)
Sodium: 139 mmol/L (ref 134–144)
Total Protein: 6.9 g/dL (ref 6.0–8.5)
eGFR: 83 mL/min/{1.73_m2} (ref >59)

## 2021-02-21 LAB — LIPID PANEL W/O CHOL/HDL RATIO
Cholesterol, Total: 199 mg/dL (ref 100–199)
HDL: 50 mg/dL (ref >39)
LDL Chol Calc (NIH): 102 mg/dL — ABNORMAL HIGH (ref 0–99)
Triglycerides: 279 mg/dL — ABNORMAL HIGH (ref 0–149)
VLDL Cholesterol Cal: 47 mg/dL — ABNORMAL HIGH (ref 5–40)

## 2021-02-21 LAB — T4, FREE: Free T4: 1.05 ng/dL (ref 0.82–1.77)

## 2021-02-21 LAB — PSA: Prostate Specific Ag, Serum: 0.3 ng/mL (ref 0.0–4.0)

## 2021-02-21 LAB — TSH: TSH: 3.45 u[IU]/mL (ref 0.450–4.500)

## 2021-02-21 NOTE — Progress Notes (Signed)
Contacted via MyChart   Good evening George Hawkins, your labs have returned: - CBC shows no anemia.  Thyroid labs are normal, continue current Levothyroxine dose. Prostate lab is normal. - Glucose, sugar, is mildly elevated.  Recommend focus on diet, lowering sugar and carbohydrate rich foods, and increasing activity level daily.   - Kidney and liver function are normal. - Cholesterol levels are above goal -- I would recommend we increase your Atorvastatin to 20 MG daily to help lower these further.  Would like to see LDL < 70 for stroke prevention, your level is 102.  Would you be okay with this increase?  Please let me know and I can send in.  Any questions? Keep being awesome!!  Thank you for allowing me to participate in your care. Kindest regards, Torria Fromer

## 2021-03-01 ENCOUNTER — Other Ambulatory Visit: Payer: Self-pay | Admitting: Nurse Practitioner

## 2021-03-01 MED ORDER — ATORVASTATIN CALCIUM 20 MG PO TABS: 20.0000 mg | ORAL_TABLET | Freq: Every day | ORAL | 3 refills | Status: DC

## 2021-03-01 MED FILL — ATORVASTATIN CALCIUM 20 MG: 20 | 90 days supply | Qty: 90 | Fill #0

## 2021-03-01 NOTE — Progress Notes (Signed)
Atorvastatin increase to 20 MG

## 2021-04-20 ENCOUNTER — Other Ambulatory Visit (HOSPITAL_COMMUNITY): Payer: Self-pay

## 2021-04-20 MED FILL — Levothyroxine Sodium Tab 150 MCG: ORAL | 90 days supply | Qty: 90 | Fill #0 | Status: AC

## 2021-06-12 MED FILL — Atenolol Tab 100 MG: ORAL | 90 days supply | Qty: 90 | Fill #0 | Status: AC

## 2021-06-13 ENCOUNTER — Other Ambulatory Visit (HOSPITAL_COMMUNITY): Payer: Self-pay

## 2021-06-20 ENCOUNTER — Other Ambulatory Visit: Payer: Self-pay

## 2021-06-20 ENCOUNTER — Other Ambulatory Visit (HOSPITAL_COMMUNITY): Payer: Self-pay

## 2021-06-20 ENCOUNTER — Ambulatory Visit: Payer: 59 | Admitting: Nurse Practitioner

## 2021-06-20 ENCOUNTER — Encounter: Payer: Self-pay | Admitting: Nurse Practitioner

## 2021-06-20 VITALS — BP 125/73 | HR 71 | Temp 98.4°F | Wt 225.0 lb

## 2021-06-20 DIAGNOSIS — E6609 Other obesity due to excess calories: Secondary | ICD-10-CM

## 2021-06-20 DIAGNOSIS — M1A0710 Idiopathic chronic gout, right ankle and foot, without tophus (tophi): Secondary | ICD-10-CM

## 2021-06-20 DIAGNOSIS — Z6831 Body mass index (BMI) 31.0-31.9, adult: Secondary | ICD-10-CM

## 2021-06-20 DIAGNOSIS — M25562 Pain in left knee: Secondary | ICD-10-CM | POA: Diagnosis not present

## 2021-06-20 MED ORDER — INDOMETHACIN 50 MG PO CAPS
50.0000 mg | ORAL_CAPSULE | Freq: Three times a day (TID) | ORAL | 0 refills | Status: DC
  Filled 2021-06-20: qty 14, 4d supply, fill #0
  Filled 2021-06-20: qty 76, 26d supply, fill #0

## 2021-06-20 MED ORDER — TRIAMCINOLONE ACETONIDE 40 MG/ML IJ SUSP
40.0000 mg | Freq: Once | INTRAMUSCULAR | Status: AC
  Administered 2021-06-20: 40 mg via INTRAMUSCULAR

## 2021-06-20 NOTE — Assessment & Plan Note (Signed)
Chronic, stable without recent flare, but current knee pain present.  Will check CBC and uric acid level today.  Continue Indocin as needed only.  No flare in > 3 years.

## 2021-06-20 NOTE — Patient Instructions (Addendum)
Knee Injection A knee injection is a procedure to get medicine into your knee joint to relieve the pain, swelling, and stiffness of arthritis. Your health care provider uses a needle to inject medicine, which may also help to lubricate and cushion yourknee joint. You may need more than one injection. Tell a health care provider about: Any allergies you have. All medicines you are taking, including vitamins, herbs, eye drops, creams, and over-the-counter medicines. Any problems you or family members have had with anesthetic medicines. Any blood disorders you have. Any surgeries you have had. Any medical conditions you have. Whether you are pregnant or may be pregnant. What are the risks? Generally, this is a safe procedure. However, problems may occur, including: Infection. Bleeding. Symptoms that get worse. Damage to the area around your knee. Allergic reaction to any of the medicines. Skin reactions from repeated injections. What happens before the procedure? Ask your health care provider about: Changing or stopping your regular medicines. This is especially important if you are taking diabetes medicines or blood thinners. Taking medicines such as aspirin and ibuprofen. These medicines can thin your blood. Do not take these medicines unless your health care provider tells you to take them. Taking over-the-counter medicines, vitamins, herbs, and supplements. Plan to have a responsible adult take you home from the hospital or clinic. What happens during the procedure?  You will sit or lie down in a position for your knee to be treated. The skin over your kneecap will be cleaned with a germ-killing soap. You will be given a medicine that numbs the area (local anesthetic). You may feel some stinging. The medicine will be injected into your knee. The needle is carefully placed between your kneecap and your knee. The medicine is injected into the joint space. The needle will be removed at  the end of the procedure. A bandage (dressing) may be placed over the injection site. The procedure may vary among health care providers and hospitals. What can I expect after the procedure? Your blood pressure, heart rate, breathing rate, and blood oxygen level will be monitored until you leave the hospital or clinic. You may have to move your knee through its full range of motion. This helps to get all the medicine into your joint space. You will be watched to make sure that you do not have a reaction to the injected medicine. You may feel more pain, swelling, and warmth than you did before the injection. This reaction may last about 1-2 days. Follow these instructions at home: Medicines Take over-the-counter and prescription medicines only as told by your health care provider. Ask your health care provider if the medicine prescribed to you requires you to avoid driving or using machinery. Do not take medicines such as aspirin and ibuprofen unless your health care provider tells you to take them. Injection site care Follow instructions from your health care provider about: How to take care of your puncture site. When and how you should change your dressing. When you should remove your dressing. Check your injection area every day for signs of infection. Check for: More redness, swelling, or pain after 2 days. Fluid or blood. Pus or a bad smell. Warmth. Managing pain, stiffness, and swelling  If directed, put ice on the injection area. To do this: Put ice in a plastic bag. Place a towel between your skin and the bag. Leave the ice on for 20 minutes, 2-3 times per day. Remove the ice if your skin turns bright red. This  is very important. If you cannot feel pain, heat, or cold, you have a greater risk of damage to the area. Do not apply heat to your knee. Raise (elevate) the injection area above the level of your heart while you are sitting or lying down.  General instructions If you  were given a dressing, keep it dry until your health care provider says it can be removed. Ask your health care provider when you can start showering or bathing. Avoid strenuous activities for as long as directed by your health care provider. Ask your health care provider when you can return to your normal activities. Keep all follow-up visits. This is important. You may need more injections. Contact a health care provider if you have: A fever. Warmth in your injection area. Fluid, blood, or pus coming from your injection site. Symptoms at your injection site that last longer than 2 days after your procedure. Get help right away if: Your knee turns very red. Your knee becomes very swollen. Your knee is in severe pain. Summary A knee injection is a procedure to get medicine into your knee joint to relieve the pain, swelling, and stiffness of arthritis. A needle is carefully placed between your kneecap and your knee to inject medicine into the joint space. Before the procedure, ask your health care provider about changing or stopping your regular medicines, especially if you are taking diabetes medicines or blood thinners. Contact your health care provider if you have any problems or questions after your procedure. This information is not intended to replace advice given to you by your health care provider. Make sure you discuss any questions you have with your healthcare provider. Document Revised: 05/24/2020 Document Reviewed: 05/24/2020 Elsevier Patient Education  2022 [employer].   Acute Knee Pain, Adult Many things can cause knee pain. Sometimes, knee pain is sudden (acute) and may be caused by damage, swelling, or irritation of the muscles andtissues that support your knee. The pain often goes away on its own with time and rest. If the pain does not goaway, tests may be done to find out what is causing the pain. Follow these instructions at home: If you have a knee sleeve or  brace:  Wear the knee sleeve or brace as told by your doctor. Take it off only as told by your doctor. Loosen it if your toes: Tingle. Become numb. Turn cold and blue. Keep it clean. If the knee sleeve or brace is not waterproof: Do not let it get wet. Cover it with a watertight covering when you take a bath or shower.  Activity Rest your knee. Do not do things that cause pain or make pain worse. Avoid activities where both feet leave the ground at the same time (high-impact activities). Examples are running, jumping rope, and doing jumping jacks. Work with a physical therapist to make a safe exercise program, as told by your doctor. Managing pain, stiffness, and swelling  If told, put ice on the knee. To do this: If you have a removable knee sleeve or brace, take it off as told by your doctor. Put ice in a plastic bag. Place a towel between your skin and the bag. Leave the ice on for 20 minutes, 2-3 times a day. Take off the ice if your skin turns bright red. This is very important. If you cannot feel pain, heat, or cold, you have a greater risk of damage to the area. If told, use an elastic bandage to put pressure (compression) on  your injured knee. Raise your knee above the level of your heart while you are sitting or lying down. Sleep with a pillow under your knee.  General instructions Take over-the-counter and prescription medicines only as told by your doctor. Do not smoke or use any products that contain nicotine or tobacco. If you need help quitting, ask your doctor. If you are overweight, work with your doctor and a food expert (dietitian) to set goals to lose weight. Being overweight can make your knee hurt more. Watch for any changes in your symptoms. Keep all follow-up visits. Contact a doctor if: The knee pain does not stop. The knee pain changes or gets worse. You have a fever along with knee pain. Your knee is red or feels warm when you touch it. Your knee  gives out or locks up. Get help right away if: Your knee swells, and the swelling gets worse. You cannot move your knee. You have very bad knee pain that does not get better with pain medicine. Summary Many things can cause knee pain. The pain often goes away on its own with time and rest. Your doctor may do tests to find out the cause of the pain. Watch for any changes in your symptoms. Relieve your pain with rest, medicines, light activity, and use of ice. Get help right away if you cannot move your knee or your knee pain is very bad. This information is not intended to replace advice given to you by your health care provider. Make sure you discuss any questions you have with your healthcare provider. Document Revised: 05/24/2020 Document Reviewed: 05/24/2020 Elsevier Patient Education  2022 [employer].

## 2021-06-20 NOTE — Progress Notes (Signed)
BP 125/73   Pulse 71   Temp 98.4 F (36.9 C) (Oral)   Wt 225 lb (102.1 kg)   SpO2 96%   BMI 31.75 kg/m    Subjective:    Patient ID: George Hawkins, male    DOB: 1961-09-23, 60 y.o.   MRN: [id number]  HPI: George Hawkins is a 60 y.o. male  Chief Complaint  Patient presents with   Knee Pain    Patient states he first noticed on Friday and Saturday he was walking around on his knees. Sunday he woke up in pain and does not know if he bruised or hurt something. Patient states when he tries to bend the L knee is when he has the most pain.    KNEE PAIN Left knee pain started over weekend, Sunday it gradually became worse. Continues to be painful.  When trying to bend has most pain.  Prior to this had been crawling on knees and working on knees fixing car.    Last imaging of this knee in 2014 noted no acute or chronic findings.  History of gout to feet, has Indocin at home. Duration: days Involved knee: left Mechanism of injury: unknown Location:anterior Onset: gradual Severity: 9/10  Quality:  sharp, aching, and throbbing Frequency: constant Radiation: no Aggravating factors: bending and movement  Alleviating factors: ice, APAP, NSAIDs, and rest  Status: worse Treatments attempted: rest, ice, APAP, ibuprofen, and aleve  Relief with NSAIDs?:  mild Weakness with weight bearing or walking: no Sensation of giving way: yes Locking: no Popping: no Bruising: no Swelling: yes Redness: no Paresthesias/decreased sensation: no Fevers: no   Relevant past medical, surgical, family and social history reviewed and updated as indicated. Interim medical history since our last visit reviewed. Allergies and medications reviewed and updated.  Review of Systems  Constitutional:  Negative for activity change, diaphoresis, fatigue and fever.  Respiratory:  Negative for cough, chest tightness, shortness of breath and wheezing.   Cardiovascular:  Negative for chest pain,  palpitations and leg swelling.  Endocrine: Negative.   Musculoskeletal:  Positive for arthralgias.  Neurological: Negative.   Psychiatric/Behavioral: Negative.     Per HPI unless specifically indicated above     Objective:    BP 125/73   Pulse 71   Temp 98.4 F (36.9 C) (Oral)   Wt 225 lb (102.1 kg)   SpO2 96%   BMI 31.75 kg/m   Wt Readings from Last 3 Encounters:  06/20/21 225 lb (102.1 kg)  02/20/21 219 lb 6.4 oz (99.5 kg)  08/23/20 219 lb (99.3 kg)    Physical Exam Vitals and nursing note reviewed.  Constitutional:      General: He is awake. He is not in acute distress.    Appearance: He is well-developed and well-groomed. He is obese. He is not ill-appearing or toxic-appearing.  HENT:     Head: Normocephalic and atraumatic.     Right Ear: Hearing normal. No drainage.     Left Ear: Hearing normal. No drainage.  Eyes:     General: Lids are normal.        Right eye: No discharge.        Left eye: No discharge.     Conjunctiva/sclera: Conjunctivae normal.     Pupils: Pupils are equal, round, and reactive to light.  Cardiovascular:     Rate and Rhythm: Normal rate and regular rhythm.     Heart sounds: Normal heart sounds, S1 normal and S2 normal. No murmur heard.  No gallop.  Pulmonary:     Effort: Pulmonary effort is normal. No accessory muscle usage or respiratory distress.     Breath sounds: Normal breath sounds.  Abdominal:     General: Bowel sounds are normal.     Palpations: Abdomen is soft.  Musculoskeletal:     Cervical back: Normal range of motion and neck supple.     Right knee: Normal.     Left knee: Swelling present. No effusion, erythema, ecchymosis, lacerations, bony tenderness or crepitus. Decreased range of motion. Tenderness present over the lateral joint line.     Right lower leg: No edema.     Left lower leg: No edema.     Comments: Unable to full bend left knee due to discomfort.  Skin:    General: Skin is warm and dry.     Capillary  Refill: Capillary refill takes less than 2 seconds.     Findings: No rash.  Neurological:     Mental Status: He is alert and oriented to person, place, and time.  Psychiatric:        Attention and Perception: Attention normal.        Mood and Affect: Mood normal.        Speech: Speech normal.        Behavior: Behavior normal. Behavior is cooperative.        Thought Content: Thought content normal.  STEROID INJECTION Procedure: Left Knee Intraarticular Steroid Injection  Description: After verbal consent and patient education on procedure area prepped and draped using semi-sterile technique. Using a anterior approach, a mixture of 4 cc of  1% Marcaine & 1 cc of Kenalog 40 was injected into knee joint.  A bandage was then placed over the injection site. Complications:  none Post Procedure Instructions: To the ER if any symptoms of erythema or swelling.   Follow Up: PRN   Results for orders placed or performed in visit on 02/20/21  CBC with Differential/Platelet  Result Value Ref Range   WBC 5.1 3.4 - 10.8 x10E3/uL   RBC 4.37 4.14 - 5.80 x10E6/uL   Hemoglobin 14.4 13.0 - 17.7 g/dL   Hematocrit 41.5 37.5 - 51.0 %   MCV 95 79 - 97 fL   MCH 33.0 26.6 - 33.0 pg   MCHC 34.7 31.5 - 35.7 g/dL   RDW 11.8 11.6 - 15.4 %   Platelets 154 150 - 450 x10E3/uL   Neutrophils 68 Not Estab. %   Lymphs 16 Not Estab. %   Monocytes 9 Not Estab. %   Eos 6 Not Estab. %   Basos 1 Not Estab. %   Neutrophils Absolute 3.4 1.4 - 7.0 x10E3/uL   Lymphocytes Absolute 0.8 0.7 - 3.1 x10E3/uL   Monocytes Absolute 0.5 0.1 - 0.9 x10E3/uL   EOS (ABSOLUTE) 0.3 0.0 - 0.4 x10E3/uL   Basophils Absolute 0.1 0.0 - 0.2 x10E3/uL   Immature Granulocytes 0 Not Estab. %   Immature Grans (Abs) 0.0 0.0 - 0.1 x10E3/uL  Comprehensive metabolic panel  Result Value Ref Range   Glucose 105 (H) 65 - 99 mg/dL   BUN 16 6 - 24 mg/dL   Creatinine, Ser 1.05 0.76 - 1.27 mg/dL   eGFR 83 >59 mL/min/1.73   BUN/Creatinine Ratio 15 9 - 20    Sodium 139 134 - 144 mmol/L   Potassium 4.1 3.5 - 5.2 mmol/L   Chloride 102 96 - 106 mmol/L   CO2 19 (L) 20 - 29 mmol/L   Calcium 9.2  8.7 - 10.2 mg/dL   Total Protein 6.9 6.0 - 8.5 g/dL   Albumin 4.7 3.8 - 4.9 g/dL   Globulin, Total 2.2 1.5 - 4.5 g/dL   Albumin/Globulin Ratio 2.1 1.2 - 2.2   Bilirubin Total 0.7 0.0 - 1.2 mg/dL   Alkaline Phosphatase 85 44 - 121 IU/L   AST 29 0 - 40 IU/L   ALT 39 0 - 44 IU/L  Lipid Panel w/o Chol/HDL Ratio  Result Value Ref Range   Cholesterol, Total 199 100 - 199 mg/dL   Triglycerides 279 (H) 0 - 149 mg/dL   HDL 50 >39 mg/dL   VLDL Cholesterol Cal 47 (H) 5 - 40 mg/dL   LDL Chol Calc (NIH) 102 (H) 0 - 99 mg/dL  TSH  Result Value Ref Range   TSH 3.450 0.450 - 4.500 uIU/mL  PSA  Result Value Ref Range   Prostate Specific Ag, Serum 0.3 0.0 - 4.0 ng/mL  T4, free  Result Value Ref Range   Free T4 1.05 0.82 - 1.77 ng/dL      Assessment & Plan:   Problem List Items Addressed This Visit       Other   Acute pain of left knee - Primary    Acute for about 3 days.  Steroid injection provided in office today, without much relief initially.  Educated on steroid injection use.  Recommend Indocin at home as ordered.  Apply ice every 2-4 hours and rest joint.  Recommend wearing knee sleeve, support, during this acute phase.  May take Tylenol as needed, up to max total 3000 MG daily.  Will check CBC and uric acid today.  Referral to ortho for further assessment as unable to bend knee without discomfort and concern for tear to ligament or meniscus.  Return in 2 weeks.       Relevant Orders   Ambulatory referral to Orthopedics   Uric acid   CBC with Differential/Platelet   Chronic gout    Chronic, stable without recent flare, but current knee pain present.  Will check CBC and uric acid level today.  Continue Indocin as needed only.  No flare in > 3 years.         Relevant Medications   indomethacin (INDOCIN) 50 MG capsule   Obesity    BMI 31.75.   Recommended eating smaller high protein, low fat meals more frequently and exercising 30 mins a day 5 times a week with a goal of 10-15lb weight loss in the next 3 months. Patient voiced their understanding and motivation to adhere to these recommendations.          Follow up plan: Return in about 2 weeks (around 07/04/2021) for Knee follow-up.

## 2021-06-20 NOTE — Assessment & Plan Note (Signed)
BMI 31.75.  Recommended eating smaller high protein, low fat meals more frequently and exercising 30 mins a day 5 times a week with a goal of 10-15lb weight loss in the next 3 months. Patient voiced their understanding and motivation to adhere to these recommendations.

## 2021-06-20 NOTE — Assessment & Plan Note (Signed)
Acute for about 3 days.  Steroid injection provided in office today, without much relief initially.  Educated on steroid injection use.  Recommend Indocin at home as ordered.  Apply ice every 2-4 hours and rest joint.  Recommend wearing knee sleeve, support, during this acute phase.  May take Tylenol as needed, up to max total 3000 MG daily.  Will check CBC and uric acid today.  Referral to ortho for further assessment as unable to bend knee without discomfort and concern for tear to ligament or meniscus.  Return in 2 weeks.

## 2021-06-21 LAB — CBC WITH DIFFERENTIAL/PLATELET
Basophils Absolute: 0 10*3/uL (ref 0.0–0.2)
Basos: 1 %
EOS (ABSOLUTE): 0.3 10*3/uL (ref 0.0–0.4)
Eos: 5 %
Hematocrit: 38.6 % (ref 37.5–51.0)
Hemoglobin: 13.3 g/dL (ref 13.0–17.7)
Immature Grans (Abs): 0 10*3/uL (ref 0.0–0.1)
Immature Granulocytes: 0 %
Lymphocytes Absolute: 0.7 10*3/uL (ref 0.7–3.1)
Lymphs: 12 %
MCH: 32.9 pg (ref 26.6–33.0)
MCHC: 34.5 g/dL (ref 31.5–35.7)
MCV: 96 fL (ref 79–97)
Monocytes Absolute: 0.6 10*3/uL (ref 0.1–0.9)
Monocytes: 10 %
Neutrophils Absolute: 4.3 10*3/uL (ref 1.4–7.0)
Neutrophils: 72 %
Platelets: 137 10*3/uL — ABNORMAL LOW (ref 150–450)
RBC: 4.04 x10E6/uL — ABNORMAL LOW (ref 4.14–5.80)
RDW: 12 % (ref 11.6–15.4)
WBC: 6 10*3/uL (ref 3.4–10.8)

## 2021-06-21 LAB — URIC ACID: Uric Acid: 7.3 mg/dL (ref 3.8–8.4)

## 2021-06-21 NOTE — Progress Notes (Signed)
Contacted via MyChart   Good evening George Hawkins, your labs have returned and uric acid is mildly elevated above goal, but less then previous check.  Continue medication.  CBC shows no elevation in white blood cell count.  Platelets are a little low, will recheck these next visit as this is new for you.  Any questions? Keep being awesome!!  Thank you for allowing me to participate in your care.  I appreciate you. Kindest regards, Athenia Rys

## 2021-06-28 ENCOUNTER — Other Ambulatory Visit (HOSPITAL_COMMUNITY): Payer: Self-pay

## 2021-06-28 DIAGNOSIS — M25562 Pain in left knee: Secondary | ICD-10-CM | POA: Diagnosis not present

## 2021-06-28 DIAGNOSIS — R52 Pain, unspecified: Secondary | ICD-10-CM | POA: Diagnosis not present

## 2021-06-28 MED ORDER — MELOXICAM 15 MG PO TABS
15.0000 mg | ORAL_TABLET | Freq: Every day | ORAL | 2 refills | Status: DC
  Filled 2021-06-28: qty 30, 30d supply, fill #0

## 2021-07-11 ENCOUNTER — Other Ambulatory Visit (HOSPITAL_COMMUNITY): Payer: Self-pay

## 2021-07-11 MED FILL — Atorvastatin Calcium Tab 20 MG (Base Equivalent): ORAL | 90 days supply | Qty: 90 | Fill #0 | Status: AC

## 2021-07-27 ENCOUNTER — Other Ambulatory Visit (HOSPITAL_COMMUNITY): Payer: Self-pay

## 2021-07-27 MED FILL — Levothyroxine Sodium Tab 150 MCG: ORAL | 90 days supply | Qty: 90 | Fill #1 | Status: AC

## 2021-08-14 ENCOUNTER — Other Ambulatory Visit (HOSPITAL_COMMUNITY): Payer: Self-pay

## 2021-08-14 ENCOUNTER — Other Ambulatory Visit: Payer: Self-pay | Admitting: Nurse Practitioner

## 2021-08-14 MED ORDER — TADALAFIL 10 MG PO TABS
10.0000 mg | ORAL_TABLET | Freq: Every day | ORAL | 5 refills | Status: DC | PRN
  Filled 2021-08-14: qty 6, 30d supply, fill #0
  Filled 2021-12-31: qty 6, 30d supply, fill #1
  Filled 2022-05-13: qty 6, 30d supply, fill #2

## 2021-08-14 NOTE — Telephone Encounter (Signed)
Patient last seen 06/20/21 and has appointment in September.

## 2021-08-14 NOTE — Telephone Encounter (Signed)
  Notes to clinic:  Verify dose for refill   Requested Prescriptions  Pending Prescriptions Disp Refills   tadalafil (CIALIS) 10 MG tablet 10 tablet 5    Sig: Take 1 tablet (10 mg total) by mouth daily as needed for erectile dysfunction. Take 30 minutes prior to anticipated sexual intercourse.  Do not take more than one dose daily.     Urology: Erectile Dysfunction Agents Passed - 08/14/2021  8:43 AM      Passed - Last BP in normal range    BP Readings from Last 1 Encounters:  06/20/21 125/73          Passed - Valid encounter within last 12 months    Recent Outpatient Visits           1 month ago Acute pain of left knee   [facility] [place], Barbaraann Faster, NP   5 months ago Primary hypertension   [place], Barbaraann Faster, NP   11 months ago Essential hypertension   [place], [place] T, NP   1 year ago Annual physical exam   [place], Barbaraann Faster, NP   1 year ago Essential hypertension   [place], Barbaraann Faster, NP       Future Appointments             In 1 week [place], Barbaraann Faster, NP [facility]

## 2021-08-23 ENCOUNTER — Encounter: Payer: Self-pay | Admitting: Nurse Practitioner

## 2021-08-23 ENCOUNTER — Ambulatory Visit: Payer: 59 | Admitting: Nurse Practitioner

## 2021-08-23 ENCOUNTER — Other Ambulatory Visit: Payer: Self-pay

## 2021-08-23 VITALS — BP 126/78 | HR 58 | Temp 97.7°F | Wt 220.0 lb

## 2021-08-23 DIAGNOSIS — I10 Essential (primary) hypertension: Secondary | ICD-10-CM

## 2021-08-23 DIAGNOSIS — E039 Hypothyroidism, unspecified: Secondary | ICD-10-CM | POA: Diagnosis not present

## 2021-08-23 DIAGNOSIS — Z6831 Body mass index (BMI) 31.0-31.9, adult: Secondary | ICD-10-CM | POA: Diagnosis not present

## 2021-08-23 DIAGNOSIS — E6609 Other obesity due to excess calories: Secondary | ICD-10-CM

## 2021-08-23 DIAGNOSIS — E781 Pure hyperglyceridemia: Secondary | ICD-10-CM | POA: Diagnosis not present

## 2021-08-23 NOTE — Assessment & Plan Note (Signed)
BMI 31.04.  Recommended eating smaller high protein, low fat meals more frequently and exercising 30 mins a day 5 times a week with a goal of 10-15lb weight loss in the next 3 months. Patient voiced their understanding and motivation to adhere to these recommendations.  

## 2021-08-23 NOTE — Assessment & Plan Note (Signed)
Chronic, ongoing with last LDL slightly elevated -- is fasting today.  Continue current medication regimen and adjust as needed.  Lipid panel today, consider addition of Fenofibrate if TRIGS remain elevated or increase Lipitor to 20 MG daily -- he is taking Fish Oil daily.  Return in 6 months.

## 2021-08-23 NOTE — Assessment & Plan Note (Signed)
Chronic, stable with TSH at goal for > 2 years.  Continue Levothyroxine at 150 MCG daily and adjust as needed.  Refills up to date.  Obtain labs at physical.  Return in 6 months for physical.

## 2021-08-23 NOTE — Assessment & Plan Note (Signed)
Chronic, ongoing.  BP at goal today on exam.  Continue current medication regimen and adjust as needed.  Refills up to date.  Recommend obtaining BP cuff and checking BP three mornings a week at home + focus on DASH diet.  Obtain labs today: BMP.  Return in 6 months for physical.

## 2021-08-23 NOTE — Patient Instructions (Signed)

## 2021-08-23 NOTE — Progress Notes (Signed)
BP 126/78 (BP Location: Left Arm, Cuff Size: Normal)   Pulse (!) 58   Temp 97.7 F (36.5 C) (Oral)   Wt 220 lb (99.8 kg)   SpO2 98%   BMI 31.04 kg/m    Subjective:    Patient ID: George Hawkins, male    DOB: May 31, 1961, 60 y.o.   MRN: [id number]  HPI: George Hawkins is a 60 y.o. male  Chief Complaint  Patient presents with   Hypothyroidism   Hyperlipidemia   Hypertension   HYPERTENSION / HYPERLIPIDEMIA Currently on Atenolol daily and Lipitor.  Satisfied with current treatment? yes Duration of hypertension: chronic BP monitoring frequency: not checking BP range:  BP medication side effects: no Duration of hyperlipidemia: chronic Cholesterol medication side effects: no Cholesterol supplements: fish oil  Medication compliance: good compliance Aspirin: yes Recent stressors: no Recurrent headaches: no Visual changes: no Palpitations: no Dyspnea: no Chest pain: no Lower extremity edema: no Dizzy/lightheaded: no    HYPOTHYROIDISM Currently taking Levothyroxine 150 MCG daily. Thyroid control status:stable Satisfied with current treatment? yes Medication side effects: no Medication compliance: good compliance Etiology of hypothyroidism:  Recent dose adjustment:no Fatigue: no Cold intolerance: no Heat intolerance: no Weight gain: no Weight loss: no Constipation: no Diarrhea/loose stools: no Palpitations: no Lower extremity edema: no Anxiety/depressed mood: no    Relevant past medical, surgical, family and social history reviewed and updated as indicated. Interim medical history since our last visit reviewed. Allergies and medications reviewed and updated.  Review of Systems  Constitutional:  Negative for activity change, diaphoresis, fatigue and fever.  Respiratory:  Negative for cough, chest tightness, shortness of breath and wheezing.   Cardiovascular:  Negative for chest pain, palpitations and leg swelling.  Gastrointestinal: Negative.    Endocrine: Negative for cold intolerance and heat intolerance.  Neurological: Negative.   Psychiatric/Behavioral: Negative.     Per HPI unless specifically indicated above     Objective:    BP 126/78 (BP Location: Left Arm, Cuff Size: Normal)   Pulse (!) 58   Temp 97.7 F (36.5 C) (Oral)   Wt 220 lb (99.8 kg)   SpO2 98%   BMI 31.04 kg/m   Wt Readings from Last 3 Encounters:  08/23/21 220 lb (99.8 kg)  06/20/21 225 lb (102.1 kg)  02/20/21 219 lb 6.4 oz (99.5 kg)    Physical Exam Vitals and nursing note reviewed.  Constitutional:      General: He is awake. He is not in acute distress.    Appearance: He is well-developed and well-groomed. He is obese. He is not ill-appearing.  HENT:     Head: Normocephalic and atraumatic.     Right Ear: Hearing normal. No drainage.     Left Ear: Hearing normal. No drainage.  Eyes:     General: Lids are normal.        Right eye: No discharge.        Left eye: No discharge.     Conjunctiva/sclera: Conjunctivae normal.     Pupils: Pupils are equal, round, and reactive to light.  Neck:     Thyroid: No thyromegaly.     Vascular: No carotid bruit.     Trachea: Trachea normal.  Cardiovascular:     Rate and Rhythm: Normal rate and regular rhythm.     Heart sounds: Normal heart sounds, S1 normal and S2 normal. No murmur heard.   No gallop.  Pulmonary:     Effort: Pulmonary effort is normal. No accessory muscle usage  or respiratory distress.     Breath sounds: Normal breath sounds.  Abdominal:     General: Bowel sounds are normal.     Palpations: Abdomen is soft.  Musculoskeletal:        General: Normal range of motion.     Cervical back: Normal range of motion and neck supple.     Right lower leg: No edema.     Left lower leg: No edema.  Lymphadenopathy:     Cervical: No cervical adenopathy.  Skin:    General: Skin is warm and dry.     Capillary Refill: Capillary refill takes less than 2 seconds.  Neurological:     Mental Status:  He is alert and oriented to person, place, and time.  Psychiatric:        Attention and Perception: Attention normal.        Mood and Affect: Mood normal.        Speech: Speech normal.        Behavior: Behavior normal. Behavior is cooperative.        Thought Content: Thought content normal.    Results for orders placed or performed in visit on 06/20/21  Uric acid  Result Value Ref Range   Uric Acid 7.3 3.8 - 8.4 mg/dL  CBC with Differential/Platelet  Result Value Ref Range   WBC 6.0 3.4 - 10.8 x10E3/uL   RBC 4.04 (L) 4.14 - 5.80 x10E6/uL   Hemoglobin 13.3 13.0 - 17.7 g/dL   Hematocrit 38.6 37.5 - 51.0 %   MCV 96 79 - 97 fL   MCH 32.9 26.6 - 33.0 pg   MCHC 34.5 31.5 - 35.7 g/dL   RDW 12.0 11.6 - 15.4 %   Platelets 137 (L) 150 - 450 x10E3/uL   Neutrophils 72 Not Estab. %   Lymphs 12 Not Estab. %   Monocytes 10 Not Estab. %   Eos 5 Not Estab. %   Basos 1 Not Estab. %   Neutrophils Absolute 4.3 1.4 - 7.0 x10E3/uL   Lymphocytes Absolute 0.7 0.7 - 3.1 x10E3/uL   Monocytes Absolute 0.6 0.1 - 0.9 x10E3/uL   EOS (ABSOLUTE) 0.3 0.0 - 0.4 x10E3/uL   Basophils Absolute 0.0 0.0 - 0.2 x10E3/uL   Immature Granulocytes 0 Not Estab. %   Immature Grans (Abs) 0.0 0.0 - 0.1 x10E3/uL      Assessment & Plan:   Problem List Items Addressed This Visit       Cardiovascular and Mediastinum   Hypertension - Primary    Chronic, ongoing.  BP at goal today on exam.  Continue current medication regimen and adjust as needed.  Refills up to date.  Recommend obtaining BP cuff and checking BP three mornings a week at home + focus on DASH diet.  Obtain labs today: BMP.  Return in 6 months for physical.      Relevant Orders   Basic metabolic panel     Endocrine   Hypothyroidism    Chronic, stable with TSH at goal for > 2 years.  Continue Levothyroxine at 150 MCG daily and adjust as needed.  Refills up to date.  Obtain labs at physical.  Return in 6 months for physical.        Other    Hyperlipidemia    Chronic, ongoing with last LDL slightly elevated -- is fasting today.  Continue current medication regimen and adjust as needed.  Lipid panel today, consider addition of Fenofibrate if TRIGS remain elevated or increase Lipitor to  20 MG daily -- he is taking Fish Oil daily.  Return in 6 months.      Relevant Orders   Lipid Panel w/o Chol/HDL Ratio   Obesity    BMI 31.04.  Recommended eating smaller high protein, low fat meals more frequently and exercising 30 mins a day 5 times a week with a goal of 10-15lb weight loss in the next 3 months. Patient voiced their understanding and motivation to adhere to these recommendations.         Follow up plan: Return in about 6 months (around 02/20/2022) for Annual physical -- next due after 02/20/22.

## 2021-08-24 LAB — LIPID PANEL W/O CHOL/HDL RATIO
Cholesterol, Total: 202 mg/dL — ABNORMAL HIGH (ref 100–199)
HDL: 52 mg/dL (ref >39)
LDL Chol Calc (NIH): 121 mg/dL — ABNORMAL HIGH (ref 0–99)
Triglycerides: 166 mg/dL — ABNORMAL HIGH (ref 0–149)
VLDL Cholesterol Cal: 29 mg/dL (ref 5–40)

## 2021-08-24 LAB — BASIC METABOLIC PANEL
BUN/Creatinine Ratio: 15 (ref 9–20)
BUN: 16 mg/dL (ref 6–24)
CO2: 22 mmol/L (ref 20–29)
Calcium: 8.8 mg/dL (ref 8.7–10.2)
Chloride: 102 mmol/L (ref 96–106)
Creatinine, Ser: 1.08 mg/dL (ref 0.76–1.27)
Glucose: 108 mg/dL — ABNORMAL HIGH (ref 65–99)
Potassium: 4.9 mmol/L (ref 3.5–5.2)
Sodium: 137 mmol/L (ref 134–144)
eGFR: 80 mL/min/{1.73_m2} (ref >59)

## 2021-08-24 NOTE — Progress Notes (Signed)
Contacted via [facility] afternoon George Hawkins, your labs have returned: - Kidney function, creatinine and eGFR, remain stable. - Glucose, sugar, remains slightly elevated.  Continue to monitor diet at home.   - Cholesterol levels remain elevated.  Are you taking Atorvastatin daily?  Were you fasting?  Please let me know, as if your answer is yes to these I would like to increase your Atorvastatin dose to get that LDL to <70.  Any questions? Keep being awesome!!  Thank you for allowing me to participate in your care.  I appreciate you. Kindest regards, Royalty Domagala

## 2021-08-28 ENCOUNTER — Other Ambulatory Visit (HOSPITAL_BASED_OUTPATIENT_CLINIC_OR_DEPARTMENT_OTHER): Payer: Self-pay

## 2021-08-28 ENCOUNTER — Other Ambulatory Visit (HOSPITAL_COMMUNITY): Payer: Self-pay

## 2021-08-28 MED ORDER — ATORVASTATIN CALCIUM 40 MG PO TABS
40.0000 mg | ORAL_TABLET | Freq: Every day | ORAL | 3 refills | Status: DC
  Filled 2021-08-28 (×2): qty 90, 90d supply, fill #0
  Filled 2022-01-15: qty 90, 90d supply, fill #1
  Filled 2022-05-13: qty 90, 90d supply, fill #2

## 2021-08-29 ENCOUNTER — Other Ambulatory Visit (HOSPITAL_COMMUNITY): Payer: Self-pay

## 2021-08-30 ENCOUNTER — Other Ambulatory Visit (HOSPITAL_COMMUNITY): Payer: Self-pay

## 2021-08-30 MED ORDER — MELOXICAM 15 MG PO TABS
15.0000 mg | ORAL_TABLET | Freq: Every day | ORAL | 2 refills | Status: DC
  Filled 2021-08-30: qty 30, 30d supply, fill #0

## 2021-09-26 ENCOUNTER — Other Ambulatory Visit (HOSPITAL_COMMUNITY): Payer: Self-pay

## 2021-09-26 MED FILL — Atenolol Tab 100 MG: ORAL | 90 days supply | Qty: 90 | Fill #1 | Status: AC

## 2021-11-01 ENCOUNTER — Other Ambulatory Visit (HOSPITAL_COMMUNITY): Payer: Self-pay

## 2021-11-01 MED FILL — Levothyroxine Sodium Tab 150 MCG: ORAL | 90 days supply | Qty: 90 | Fill #2 | Status: AC

## 2021-12-11 DIAGNOSIS — H524 Presbyopia: Secondary | ICD-10-CM | POA: Diagnosis not present

## 2021-12-31 ENCOUNTER — Other Ambulatory Visit (HOSPITAL_COMMUNITY): Payer: Self-pay

## 2021-12-31 MED FILL — Atenolol Tab 100 MG: ORAL | 90 days supply | Qty: 90 | Fill #2 | Status: AC

## 2022-01-15 ENCOUNTER — Other Ambulatory Visit (HOSPITAL_COMMUNITY): Payer: Self-pay

## 2022-02-17 NOTE — Patient Instructions (Signed)
DASH Eating Plan °DASH stands for Dietary Approaches to Stop Hypertension. The DASH eating plan is a healthy eating plan that has been shown to: °Reduce high blood pressure (hypertension). °Reduce your risk for type 2 diabetes, heart disease, and stroke. °Help with weight loss. °What are tips for following this plan? °Reading food labels °Check food labels for the amount of salt (sodium) per serving. Choose foods with less than 5 percent of the Daily Value of sodium. Generally, foods with less than 300 milligrams (mg) of sodium per serving fit into this eating plan. °To find whole grains, look for the word "whole" as the first word in the ingredient list. °Shopping °Buy products labeled as "low-sodium" or "no salt added." °Buy fresh foods. Avoid canned foods and pre-made or frozen meals. °Cooking °Avoid adding salt when cooking. Use salt-free seasonings or herbs instead of table salt or sea salt. Check with your health care provider or pharmacist before using salt substitutes. °Do not fry foods. Cook foods using healthy methods such as baking, boiling, grilling, roasting, and broiling instead. °Cook with heart-healthy oils, such as olive, canola, avocado, soybean, or sunflower oil. °Meal planning ° °Eat a balanced diet that includes: °4 or more servings of fruits and 4 or more servings of vegetables each day. Try to fill one-half of your plate with fruits and vegetables. °6-8 servings of whole grains each day. °Less than 6 oz (170 g) of lean meat, poultry, or fish each day. A 3-oz (85-g) serving of meat is about the same size as a deck of cards. One egg equals 1 oz (28 g). °2-3 servings of low-fat dairy each day. One serving is 1 cup (237 mL). °1 serving of nuts, seeds, or beans 5 times each week. °2-3 servings of heart-healthy fats. Healthy fats called omega-3 fatty acids are found in foods such as walnuts, flaxseeds, fortified milks, and eggs. These fats are also found in cold-water fish, such as sardines, salmon,  and mackerel. °Limit how much you eat of: °Canned or prepackaged foods. °Food that is high in trans fat, such as some fried foods. °Food that is high in saturated fat, such as fatty meat. °Desserts and other sweets, sugary drinks, and other foods with added sugar. °Full-fat dairy products. °Do not salt foods before eating. °Do not eat more than 4 egg yolks a week. °Try to eat at least 2 vegetarian meals a week. °Eat more home-cooked food and less restaurant, buffet, and fast food. °Lifestyle °When eating at a restaurant, ask that your food be prepared with less salt or no salt, if possible. °If you drink alcohol: °Limit how much you use to: °0-1 drink a day for women who are not pregnant. °0-2 drinks a day for men. °Be aware of how much alcohol is in your drink. In the [place] one drink equals one 12 oz bottle of beer (355 mL), one 5 oz glass of wine (148 mL), or one 1½ oz glass of hard liquor (44 mL). °General information °Avoid eating more than 2,300 mg of salt a day. If you have hypertension, you may need to reduce your sodium intake to 1,500 mg a day. °Work with your health care provider to maintain a healthy body weight or to lose weight. Ask what an ideal weight is for you. °Get at least 30 minutes of exercise that causes your heart to beat faster (aerobic exercise) most days of the week. Activities may include walking, swimming, or biking. °Work with your health care provider or dietitian to   adjust your eating plan to your individual calorie needs. °What foods should I eat? °Fruits °All fresh, dried, or frozen fruit. Canned fruit in natural juice (without added sugar). °Vegetables °Fresh or frozen vegetables (raw, steamed, roasted, or grilled). Low-sodium or reduced-sodium tomato and vegetable juice. Low-sodium or reduced-sodium tomato sauce and tomato paste. Low-sodium or reduced-sodium canned vegetables. °Grains °Whole-grain or whole-wheat bread. Whole-grain or whole-wheat pasta. Brown rice. Oatmeal. Quinoa.  Bulgur. Whole-grain and low-sodium cereals. Pita bread. Low-fat, low-sodium crackers. Whole-wheat flour tortillas. °Meats and other proteins °Skinless chicken or turkey. Ground chicken or turkey. Pork with fat trimmed off. Fish and seafood. Egg whites. Dried beans, peas, or lentils. Unsalted nuts, nut butters, and seeds. Unsalted canned beans. Lean cuts of beef with fat trimmed off. Low-sodium, lean precooked or cured meat, such as sausages or meat loaves. °Dairy °Low-fat (1%) or fat-free (skim) milk. Reduced-fat, low-fat, or fat-free cheeses. Nonfat, low-sodium ricotta or cottage cheese. Low-fat or nonfat yogurt. Low-fat, low-sodium cheese. °Fats and oils °Soft margarine without trans fats. Vegetable oil. Reduced-fat, low-fat, or light mayonnaise and salad dressings (reduced-sodium). Canola, safflower, olive, avocado, soybean, and sunflower oils. Avocado. °Seasonings and condiments °Herbs. Spices. Seasoning mixes without salt. °Other foods °Unsalted popcorn and pretzels. Fat-free sweets. °The items listed above may not be a complete list of foods and beverages you can eat. Contact a dietitian for more information. °What foods should I avoid? °Fruits °Canned fruit in a light or heavy syrup. Fried fruit. Fruit in cream or butter sauce. °Vegetables °Creamed or fried vegetables. Vegetables in a cheese sauce. Regular canned vegetables (not low-sodium or reduced-sodium). Regular canned tomato sauce and paste (not low-sodium or reduced-sodium). Regular tomato and vegetable juice (not low-sodium or reduced-sodium). Pickles. Olives. °Grains °Baked goods made with fat, such as croissants, muffins, or some breads. Dry pasta or rice meal packs. °Meats and other proteins °Fatty cuts of meat. Ribs. Fried meat. Bacon. Bologna, salami, and other precooked or cured meats, such as sausages or meat loaves. Fat from the back of a pig (fatback). Bratwurst. Salted nuts and seeds. Canned beans with added salt. Canned or smoked fish.  Whole eggs or egg yolks. Chicken or turkey with skin. °Dairy °Whole or 2% milk, cream, and half-and-half. Whole or full-fat cream cheese. Whole-fat or sweetened yogurt. Full-fat cheese. Nondairy creamers. Whipped toppings. Processed cheese and cheese spreads. °Fats and oils °Butter. Stick margarine. Lard. Shortening. Ghee. Bacon fat. Tropical oils, such as coconut, palm kernel, or palm oil. °Seasonings and condiments °Onion salt, garlic salt, seasoned salt, table salt, and sea salt. Worcestershire sauce. Tartar sauce. Barbecue sauce. Teriyaki sauce. Soy sauce, including reduced-sodium. Steak sauce. Canned and packaged gravies. Fish sauce. Oyster sauce. Cocktail sauce. Store-bought horseradish. Ketchup. Mustard. Meat flavorings and tenderizers. Bouillon cubes. Hot sauces. Pre-made or packaged marinades. Pre-made or packaged taco seasonings. Relishes. Regular salad dressings. °Other foods °Salted popcorn and pretzels. °The items listed above may not be a complete list of foods and beverages you should avoid. Contact a dietitian for more information. °Where to find more information °National Heart, Lung, and Blood Institute: www.nhlbi.nih.gov °American Heart Association: www.heart.org °Academy of Nutrition and Dietetics: www.eatright.org °National Kidney Foundation: www.kidney.org °Summary °The DASH eating plan is a healthy eating plan that has been shown to reduce high blood pressure (hypertension). It may also reduce your risk for type 2 diabetes, heart disease, and stroke. °When on the DASH eating plan, aim to eat more fresh fruits and vegetables, whole grains, lean proteins, low-fat dairy, and heart-healthy fats. °With the DASH   eating plan, you should limit salt (sodium) intake to 2,300 mg a day. If you have hypertension, you may need to reduce your sodium intake to 1,500 mg a day. °Work with your health care provider or dietitian to adjust your eating plan to your individual calorie needs. °This information is not  intended to replace advice given to you by your health care provider. Make sure you discuss any questions you have with your health care provider. °Document Revised: 11/12/2019 Document Reviewed: 11/12/2019 °Elsevier Patient Education © 2022 [employer] ° °

## 2022-02-18 ENCOUNTER — Other Ambulatory Visit (HOSPITAL_COMMUNITY): Payer: Self-pay

## 2022-02-18 MED FILL — Levothyroxine Sodium Tab 150 MCG: ORAL | 90 days supply | Qty: 90 | Fill #3 | Status: AC

## 2022-02-22 ENCOUNTER — Other Ambulatory Visit (HOSPITAL_COMMUNITY): Payer: Self-pay

## 2022-02-22 ENCOUNTER — Ambulatory Visit (INDEPENDENT_AMBULATORY_CARE_PROVIDER_SITE_OTHER): Payer: 59 | Admitting: Nurse Practitioner

## 2022-02-22 ENCOUNTER — Other Ambulatory Visit: Payer: Self-pay

## 2022-02-22 ENCOUNTER — Encounter: Payer: Self-pay | Admitting: Nurse Practitioner

## 2022-02-22 VITALS — BP 128/83 | HR 68 | Temp 97.7°F | Ht 71.5 in | Wt 223.0 lb

## 2022-02-22 DIAGNOSIS — E039 Hypothyroidism, unspecified: Secondary | ICD-10-CM

## 2022-02-22 DIAGNOSIS — I10 Essential (primary) hypertension: Secondary | ICD-10-CM

## 2022-02-22 DIAGNOSIS — E6609 Other obesity due to excess calories: Secondary | ICD-10-CM | POA: Diagnosis not present

## 2022-02-22 DIAGNOSIS — N400 Enlarged prostate without lower urinary tract symptoms: Secondary | ICD-10-CM

## 2022-02-22 DIAGNOSIS — E781 Pure hyperglyceridemia: Secondary | ICD-10-CM

## 2022-02-22 DIAGNOSIS — Z0000 Encounter for general adult medical examination without abnormal findings: Secondary | ICD-10-CM | POA: Diagnosis not present

## 2022-02-22 DIAGNOSIS — Z23 Encounter for immunization: Secondary | ICD-10-CM | POA: Diagnosis not present

## 2022-02-22 DIAGNOSIS — N522 Drug-induced erectile dysfunction: Secondary | ICD-10-CM

## 2022-02-22 DIAGNOSIS — M1A0710 Idiopathic chronic gout, right ankle and foot, without tophus (tophi): Secondary | ICD-10-CM

## 2022-02-22 DIAGNOSIS — Z6830 Body mass index (BMI) 30.0-30.9, adult: Secondary | ICD-10-CM

## 2022-02-22 LAB — MICROALBUMIN, URINE WAIVED
Creatinine, Urine Waived: 50 mg/dL (ref 10–300)
Microalb, Ur Waived: 10 mg/L (ref 0–19)
Microalb/Creat Ratio: <30 mg/g (ref <30)

## 2022-02-22 MED ORDER — ATENOLOL 100 MG PO TABS
ORAL_TABLET | Freq: Every day | ORAL | 4 refills | Status: DC
Start: 2022-02-22 — End: 2023-02-25
  Filled 2022-02-22: qty 90, fill #0
  Filled 2022-04-18: qty 90, 90d supply, fill #0
  Filled 2022-08-01: qty 90, 90d supply, fill #1
  Filled 2022-10-25: qty 90, 90d supply, fill #2
  Filled 2023-01-24: qty 90, 90d supply, fill #3

## 2022-02-22 MED ORDER — LEVOTHYROXINE SODIUM 150 MCG PO TABS
150.0000 ug | ORAL_TABLET | Freq: Every day | ORAL | 4 refills | Status: DC
  Filled 2022-02-22: qty 90, fill #0
  Filled 2022-05-20: qty 90, 90d supply, fill #0
  Filled 2022-08-18: qty 90, 90d supply, fill #1
  Filled 2022-11-06: qty 90, 90d supply, fill #2
  Filled 2023-02-15: qty 90, 90d supply, fill #3

## 2022-02-22 MED ORDER — ATENOLOL 100 MG PO TABS
ORAL_TABLET | Freq: Every day | ORAL | 4 refills | Status: DC
  Filled 2022-02-22: qty 90, fill #0

## 2022-02-22 NOTE — Assessment & Plan Note (Signed)
Chronic, ongoing.  BP at goal today on exam.  Continue current medication regimen and adjust as needed.  Refills sent in.  Recommend obtaining BP cuff and checking BP three mornings a week at home + focus on DASH diet.  Obtain labs today: CMP, CBC, urine ALB.  Return in 6 months. ?

## 2022-02-22 NOTE — Assessment & Plan Note (Signed)
Chronic, stable with TSH at goal for > 3 years.  Continue Levothyroxine at 150 MCG daily and adjust as needed.  Refills sent in.  Free T4 and TSH today + antibody.  Return in 6 months. ?

## 2022-02-22 NOTE — Assessment & Plan Note (Signed)
BMI 30.67.  Recommended eating smaller high protein, low fat meals more frequently and exercising 30 mins a day 5 times a week with a goal of 10-15lb weight loss in the next 3 months. Patient voiced their understanding and motivation to adhere to these recommendations. ? ?

## 2022-02-22 NOTE — Assessment & Plan Note (Signed)
Chronic, stable without recent flare.  Will check CBC and uric acid level today.  Continue Indocin as needed only.  No flare in > 4 years.   ?

## 2022-02-22 NOTE — Assessment & Plan Note (Signed)
Chronic, ongoing.  Continue current medication regimen and adjust as needed.  Lipid panel today.  Return in 6 months. 

## 2022-02-22 NOTE — Progress Notes (Signed)
BP 128/83    Pulse 68    Temp 97.7 F (36.5 C)    Ht 5' 11.5" (1.816 m)    Wt 223 lb (101.2 kg)    SpO2 97%    BMI 30.67 kg/m    Subjective:    Patient ID: George Hawkins, male    DOB: 1961/04/02, 61 y.o.   MRN: [id number]  HPI: George Hawkins is a 61 y.o. male presenting on 02/22/2022 for comprehensive medical examination. Current medical complaints include:none  He currently lives with: wife Interim Problems from his last visit: no   HYPERTENSION / HYPERLIPIDEMIA Currently on Atenolol daily and Lipitor.    He has a history of gout, but has not had flare in over 4 years.  No current chronic gout medications, has Indocin script he can use as needed. Satisfied with current treatment? yes Duration of hypertension: chronic BP monitoring frequency: not checking BP range:  BP medication side effects: no Duration of hyperlipidemia: chronic Cholesterol medication side effects: no Cholesterol supplements: fish oil  Medication compliance: good compliance Aspirin: yes Recent stressors: no Recurrent headaches: no Visual changes: no Palpitations: no Dyspnea: no Chest pain: no Lower extremity edema: no Dizzy/lightheaded: no  The 10-year ASCVD risk score (Arnett DK, et al., 2019) is: 8.3%   Values used to calculate the score:     Age: 24 years     Sex: Male     Is Non-Hispanic African American: No     Diabetic: No     Tobacco smoker: No     Systolic Blood Pressure: 696 mmHg     Is BP treated: Yes     HDL Cholesterol: 52 mg/dL     Total Cholesterol: 202 mg/dL   HYPOTHYROIDISM Currently taking Levothyroxine 150 MCG daily. Thyroid control status:stable Satisfied with current treatment? yes Medication side effects: no Medication compliance: good compliance Etiology of hypothyroidism:  Recent dose adjustment:no Fatigue: no Cold intolerance: no Heat intolerance: no Weight gain: no Weight loss: no Constipation: no Diarrhea/loose stools: no Palpitations: no Lower  extremity edema: no Anxiety/depressed mood: no   ERECTILE DYSFUNCTION Uses Cialis.  Can achieve erection, but has difficulty maintaining.  Taking Cialis without ADR.  Denies any family history of MI or CVA.  Functional Status Survey: Is the patient deaf or have difficulty hearing?: No Does the patient have difficulty seeing, even when wearing glasses/contacts?: No Does the patient have difficulty concentrating, remembering, or making decisions?: No Does the patient have difficulty walking or climbing stairs?: No Does the patient have difficulty dressing or bathing?: No Does the patient have difficulty doing errands alone such as visiting a doctor's office or shopping?: No  FALL RISK: Fall Risk  02/22/2022 02/20/2021 02/18/2019 02/09/2018 11/04/2016  Falls in the past year? 0 0 0 No No  Number falls in past yr: 0 - - - -  Injury with Fall? 0 0 - - -  Risk for fall due to : No Fall Risks - - - -  Follow up Falls evaluation completed - Falls evaluation completed - -    Depression Screen Depression screen [facility] 2/9 02/22/2022 02/20/2021 02/21/2020 02/18/2019 02/09/2018  Decreased Interest 0 0 0 0 0  Down, Depressed, Hopeless 0 0 0 0 0  PHQ - 2 Score 0 0 0 0 0  Altered sleeping 0 0 0 0 0  Tired, decreased energy 0 0 0 0 0  Change in appetite 0 0 0 0 0  Feeling bad or failure about yourself  0 0 0 0 0  Trouble concentrating 0 0 0 0 0  Moving slowly or fidgety/restless 0 0 0 0 0  Suicidal thoughts 0 0 0 0 0  PHQ-9 Score 0 0 0 0 0  Difficult doing work/chores Not difficult at all - Not difficult at all Not difficult at all -    Past Medical History:  Past Medical History:  Diagnosis Date   Gout    Hyperlipidemia    Hypertension    Hypothyroidism     Surgical History:  Past Surgical History:  Procedure Laterality Date   TENDON REPAIR Right    Hand   VASECTOMY      Medications:  Current Outpatient Medications on File Prior to Visit  Medication Sig   aspirin 81 MG chewable tablet Chew  81 mg by mouth daily.   atorvastatin (LIPITOR) 40 MG tablet Take 1 tablet (40 mg total) by mouth daily.   Fish Oil OIL by Does not apply route.   indomethacin (INDOCIN) 50 MG capsule Take 1 capsule (50 mg total) by mouth 3 (three) times daily as needed   meloxicam (MOBIC) 15 MG tablet Take 1 tablet (15 mg total) by mouth daily.   tadalafil (CIALIS) 10 MG tablet Take 1 tablet (10 mg total) by mouth daily as needed for erectile dysfunction. Take 30 minutes prior to anticipated sexual intercourse.  Do not take more than one dose daily.   No current facility-administered medications on file prior to visit.    Allergies:  No Known Allergies  Social History:  Social History   Socioeconomic History   Marital status: Married    Spouse name: Not on file   Number of children: Not on file   Years of education: Not on file   Highest education level: Not on file  Occupational History   Not on file  Tobacco Use   Smoking status: Never   Smokeless tobacco: Never  Vaping Use   Vaping Use: Never used  Substance and Sexual Activity   Alcohol use: Yes    Alcohol/week: 14.0 standard drinks    Types: 14 Cans of beer per week   Drug use: No   Sexual activity: Yes  Other Topics Concern   Not on file  Social History Narrative   Not on file   Social Determinants of Health   Financial Resource Strain: Not on file  Food Insecurity: Not on file  Transportation Needs: Not on file  Physical Activity: Not on file  Stress: Not on file  Social Connections: Not on file  Intimate Partner Violence: Not on file   Social History   Tobacco Use  Smoking Status Never  Smokeless Tobacco Never   Social History   Substance and Sexual Activity  Alcohol Use Yes   Alcohol/week: 14.0 standard drinks   Types: 14 Cans of beer per week    Family History:  Family History  Problem Relation Age of Onset   Thyroid disease Brother    Thyroid disease Sister    Thyroid disease Brother    Heart attack Neg  Hx    Hyperlipidemia Neg Hx    Hypertension Neg Hx    Sudden death Neg Hx     Past medical history, surgical history, medications, allergies, family history and social history reviewed with patient today and changes made to appropriate areas of the chart.   Review of Systems - Negative All other ROS negative except what is listed above and in the HPI.  Objective:    BP 128/83    Pulse 68    Temp 97.7 F (36.5 C)    Ht 5' 11.5" (1.816 m)    Wt 223 lb (101.2 kg)    SpO2 97%    BMI 30.67 kg/m   Wt Readings from Last 3 Encounters:  02/22/22 223 lb (101.2 kg)  08/23/21 220 lb (99.8 kg)  06/20/21 225 lb (102.1 kg)    Physical Exam Vitals and nursing note reviewed.  Constitutional:      General: He is awake.     Appearance: He is well-developed.  HENT:     Head: Normocephalic and atraumatic.     Right Ear: Hearing, tympanic membrane, ear canal and external ear normal. No decreased hearing noted. No drainage.     Left Ear: Hearing, tympanic membrane, ear canal and external ear normal. No decreased hearing noted. No drainage.     Nose: Nose normal.     Right Sinus: No maxillary sinus tenderness or frontal sinus tenderness.     Left Sinus: No maxillary sinus tenderness or frontal sinus tenderness.     Mouth/Throat:     Pharynx: Uvula midline.  Eyes:     General: Lids are normal.        Right eye: No discharge.        Left eye: No discharge.     Conjunctiva/sclera: Conjunctivae normal.     Pupils: Pupils are equal, round, and reactive to light.     Visual Fields: Right eye visual fields normal and left eye visual fields normal.  Neck:     Thyroid: No thyromegaly.     Vascular: No carotid bruit or JVD.     Trachea: Trachea normal.  Cardiovascular:     Rate and Rhythm: Normal rate and regular rhythm.     Heart sounds: Normal heart sounds, S1 normal and S2 normal. No murmur heard.   No gallop.  Pulmonary:     Effort: Pulmonary effort is normal.     Breath sounds: Normal  breath sounds.  Abdominal:     General: Bowel sounds are normal.     Palpations: Abdomen is soft. There is no hepatomegaly or splenomegaly.  Genitourinary:    Penis: Normal.      Rectum: Normal.  Musculoskeletal:        General: Normal range of motion.     Cervical back: Normal range of motion and neck supple.     Right lower leg: No edema.     Left lower leg: No edema.  Lymphadenopathy:     Cervical: No cervical adenopathy.  Skin:    General: Skin is warm and dry.     Capillary Refill: Capillary refill takes less than 2 seconds.     Findings: No rash.  Neurological:     Mental Status: He is alert and oriented to person, place, and time.     Sensory: Sensation is intact.     Motor: Motor function is intact.     Coordination: Coordination is intact.     Gait: Gait is intact.     Deep Tendon Reflexes: Reflexes are normal and symmetric.     Reflex Scores:      Brachioradialis reflexes are 2+ on the right side and 2+ on the left side.      Patellar reflexes are 2+ on the right side and 2+ on the left side. Psychiatric:        Attention and Perception: Attention normal.  Mood and Affect: Mood normal.        Speech: Speech normal.        Behavior: Behavior normal. Behavior is cooperative.        Thought Content: Thought content normal.        Cognition and Memory: Cognition normal.        Judgment: Judgment normal.   Results for orders placed or performed in visit on 35/46/56  Basic metabolic panel  Result Value Ref Range   Glucose 108 (H) 65 - 99 mg/dL   BUN 16 6 - 24 mg/dL   Creatinine, Ser 1.08 0.76 - 1.27 mg/dL   eGFR 80 >59 mL/min/1.73   BUN/Creatinine Ratio 15 9 - 20   Sodium 137 134 - 144 mmol/L   Potassium 4.9 3.5 - 5.2 mmol/L   Chloride 102 96 - 106 mmol/L   CO2 22 20 - 29 mmol/L   Calcium 8.8 8.7 - 10.2 mg/dL  Lipid Panel w/o Chol/HDL Ratio  Result Value Ref Range   Cholesterol, Total 202 (H) 100 - 199 mg/dL   Triglycerides 166 (H) 0 - 149 mg/dL   HDL  52 >39 mg/dL   VLDL Cholesterol Cal 29 5 - 40 mg/dL   LDL Chol Calc (NIH) 121 (H) 0 - 99 mg/dL      Assessment & Plan:   Problem List Items Addressed This Visit       Cardiovascular and Mediastinum   Hypertension - Primary    Chronic, ongoing.  BP at goal today on exam.  Continue current medication regimen and adjust as needed.  Refills sent in.  Recommend obtaining BP cuff and checking BP three mornings a week at home + focus on DASH diet.  Obtain labs today: CMP, CBC, urine ALB.  Return in 6 months.      Relevant Medications   atenolol (TENORMIN) 100 MG tablet   Other Relevant Orders   Microalbumin, Urine Waived   CBC with Differential/Platelet     Endocrine   Hypothyroidism    Chronic, stable with TSH at goal for > 3 years.  Continue Levothyroxine at 150 MCG daily and adjust as needed.  Refills sent in.  Free T4 and TSH today + antibody.  Return in 6 months.      Relevant Medications   atenolol (TENORMIN) 100 MG tablet   levothyroxine (SYNTHROID) 150 MCG tablet   Other Relevant Orders   TSH   T4, free   Thyroid peroxidase antibody     Other   Chronic gout    Chronic, stable without recent flare.  Will check CBC and uric acid level today.  Continue Indocin as needed only.  No flare in > 4 years.        Relevant Orders   Comprehensive metabolic panel   Uric acid   Erectile dysfunction    Stable with addition of Cialis.  Will send in refills and adjust regimen as needed.      Hyperlipidemia    Chronic, ongoing.  Continue current medication regimen and adjust as needed.  Lipid panel today.  Return in 6 months.      Relevant Medications   atenolol (TENORMIN) 100 MG tablet   Other Relevant Orders   Comprehensive metabolic panel   Lipid Panel w/o Chol/HDL Ratio   Obesity    BMI 30.67.  Recommended eating smaller high protein, low fat meals more frequently and exercising 30 mins a day 5 times a week with a goal of 10-15lb weight loss  in the next 3 months. Patient  voiced their understanding and motivation to adhere to these recommendations.       Other Visit Diagnoses     Benign prostatic hyperplasia without lower urinary tract symptoms       PSA on labs today.   Relevant Orders   PSA   Need for Td vaccine       Td in office today.   Relevant Orders   Td vaccine greater than or equal to 7yo preservative free IM   Encounter for annual physical exam       Annual physical today with labs and health maintenance reviewed.       Discussed aspirin prophylaxis for myocardial infarction prevention and decision was made to continue ASA  LABORATORY TESTING:  Health maintenance labs ordered today as discussed above.   The natural history of prostate cancer and ongoing controversy regarding screening and potential treatment outcomes of prostate cancer has been discussed with the patient. The meaning of a false positive PSA and a false negative PSA has been discussed. He indicates understanding of the limitations of this screening test and wishes to proceed with screening PSA testing.   IMMUNIZATIONS:   - Tdap: Tetanus vaccination status reviewed: provided today - Influenza: Refused - Pneumovax: Not applicable - Prevnar: Not applicable - Zostavax vaccine: Refused  - Covid vaccines x 2 obtained  SCREENING: - Colonoscopy: Up to date  Discussed with patient purpose of the colonoscopy is to detect colon cancer at curable precancerous or early stages   - AAA Screening: Not applicable  -Hearing Test: Not applicable  -Spirometry: Not applicable   PATIENT COUNSELING:    Sexuality: Discussed sexually transmitted diseases, partner selection, use of condoms, avoidance of unintended pregnancy  and contraceptive alternatives.   Advised to avoid cigarette smoking.  I discussed with the patient that most people either abstain from alcohol or drink within safe limits (<=14/week and <=4 drinks/occasion for males, <=7/weeks and <= 3 drinks/occasion for  females) and that the risk for alcohol disorders and other health effects rises proportionally with the number of drinks per week and how often a drinker exceeds daily limits.  Discussed cessation/primary prevention of drug use and availability of treatment for abuse.   Diet: Encouraged to adjust caloric intake to maintain  or achieve ideal body weight, to reduce intake of dietary saturated fat and total fat, to limit sodium intake by avoiding high sodium foods and not adding table salt, and to maintain adequate dietary potassium and calcium preferably from fresh fruits, vegetables, and low-fat dairy products.    Stressed the importance of regular exercise  Injury prevention: Discussed safety belts, safety helmets, smoke detector, smoking near bedding or upholstery.   Dental health: Discussed importance of regular tooth brushing, flossing, and dental visits.   Follow up plan: NEXT PREVENTATIVE PHYSICAL DUE IN 1 YEAR. Return in about 6 months (around 08/25/2022) for HTN/HLD, THYROID.

## 2022-02-22 NOTE — Assessment & Plan Note (Signed)
Stable with addition of Cialis.  Will send in refills and adjust regimen as needed. 

## 2022-02-23 LAB — COMPREHENSIVE METABOLIC PANEL
ALT: 41 IU/L (ref 0–44)
AST: 37 IU/L (ref 0–40)
Albumin/Globulin Ratio: 2.2 (ref 1.2–2.2)
Albumin: 4.8 g/dL (ref 3.8–4.9)
Alkaline Phosphatase: 90 IU/L (ref 44–121)
BUN/Creatinine Ratio: 15 (ref 9–20)
BUN: 15 mg/dL (ref 6–24)
Bilirubin Total: 0.9 mg/dL (ref 0.0–1.2)
CO2: 20 mmol/L (ref 20–29)
Calcium: 8.8 mg/dL (ref 8.7–10.2)
Chloride: 102 mmol/L (ref 96–106)
Creatinine, Ser: 0.97 mg/dL (ref 0.76–1.27)
Globulin, Total: 2.2 g/dL (ref 1.5–4.5)
Glucose: 112 mg/dL — ABNORMAL HIGH (ref 70–99)
Potassium: 4.3 mmol/L (ref 3.5–5.2)
Sodium: 138 mmol/L (ref 134–144)
Total Protein: 7 g/dL (ref 6.0–8.5)
eGFR: 90 mL/min/{1.73_m2} (ref >59)

## 2022-02-23 LAB — CBC WITH DIFFERENTIAL/PLATELET
Basophils Absolute: 0.1 10*3/uL (ref 0.0–0.2)
Basos: 1 %
EOS (ABSOLUTE): 0.5 10*3/uL — ABNORMAL HIGH (ref 0.0–0.4)
Eos: 9 %
Hematocrit: 42.3 % (ref 37.5–51.0)
Hemoglobin: 14.6 g/dL (ref 13.0–17.7)
Immature Grans (Abs): 0 10*3/uL (ref 0.0–0.1)
Immature Granulocytes: 1 %
Lymphocytes Absolute: 0.9 10*3/uL (ref 0.7–3.1)
Lymphs: 15 %
MCH: 33.3 pg — ABNORMAL HIGH (ref 26.6–33.0)
MCHC: 34.5 g/dL (ref 31.5–35.7)
MCV: 97 fL (ref 79–97)
Monocytes Absolute: 0.5 10*3/uL (ref 0.1–0.9)
Monocytes: 9 %
Neutrophils Absolute: 3.8 10*3/uL (ref 1.4–7.0)
Neutrophils: 65 %
Platelets: 144 10*3/uL — ABNORMAL LOW (ref 150–450)
RBC: 4.38 x10E6/uL (ref 4.14–5.80)
RDW: 12.5 % (ref 11.6–15.4)
WBC: 5.8 10*3/uL (ref 3.4–10.8)

## 2022-02-23 LAB — THYROID PEROXIDASE ANTIBODY: Thyroperoxidase Ab SerPl-aCnc: 20 [IU]/mL (ref 0–34)

## 2022-02-23 LAB — TSH: TSH: 9.22 u[IU]/mL — ABNORMAL HIGH (ref 0.450–4.500)

## 2022-02-23 LAB — PSA: Prostate Specific Ag, Serum: 0.2 ng/mL (ref 0.0–4.0)

## 2022-02-23 LAB — LIPID PANEL W/O CHOL/HDL RATIO
Cholesterol, Total: 176 mg/dL (ref 100–199)
HDL: 55 mg/dL (ref >39)
LDL Chol Calc (NIH): 79 mg/dL (ref 0–99)
Triglycerides: 256 mg/dL — ABNORMAL HIGH (ref 0–149)
VLDL Cholesterol Cal: 42 mg/dL — ABNORMAL HIGH (ref 5–40)

## 2022-02-23 LAB — T4, FREE: Free T4: 1.17 ng/dL (ref 0.82–1.77)

## 2022-02-23 LAB — URIC ACID: Uric Acid: 11.6 mg/dL — ABNORMAL HIGH (ref 3.8–8.4)

## 2022-02-24 ENCOUNTER — Other Ambulatory Visit: Payer: Self-pay | Admitting: Nurse Practitioner

## 2022-02-24 DIAGNOSIS — E039 Hypothyroidism, unspecified: Secondary | ICD-10-CM

## 2022-02-24 DIAGNOSIS — M1A0710 Idiopathic chronic gout, right ankle and foot, without tophus (tophi): Secondary | ICD-10-CM

## 2022-02-24 MED ORDER — ALLOPURINOL 100 MG PO TABS
100.0000 mg | ORAL_TABLET | Freq: Every day | ORAL | 4 refills | Status: DC
  Filled 2022-02-24: qty 90, 90d supply, fill #0

## 2022-02-24 NOTE — Progress Notes (Signed)
Contacted via [place] - need lab visit for 4 weeks please. ? ? ?Good morning George Hawkins, your labs have returned: ?- Kidney function, creatinine and eGFR, remains normal, as is liver function, AST and ALT.   ?- CBC shows no anemia or infection. Platelet count is a little on low side, although improved from last check.  I would recommend changing Aspirin to every other day. ?- Cholesterol labs stable, continue Atorvastatin. ?- PSA, prostate lab, is normal. ?- Thyroid labs show elevation in TSH -- but normal Free T4 and antibody, please continue current Levothyroxine dosing but ensure you are taking it first thing in morning 30 minutes before food or other medications.  I would like you to return for lab visit only in 4 weeks to recheck this and see if we need to adjust Levothyroxine dosing.   ?- Uric acid level is elevated, putting you at high risk for gout issues.  I am going to send in some Allopurinol for you to start taking daily to help lower uric acid levels and prevent gout flares.  Then we will recheck this level in 4 weeks too.  Any questions? ?Keep being amazing!!  Thank you for allowing me to participate in your care.  I appreciate you. ?Kindest regards, ?Kenlyn Lose ?

## 2022-02-25 ENCOUNTER — Other Ambulatory Visit (HOSPITAL_COMMUNITY): Payer: Self-pay

## 2022-03-14 ENCOUNTER — Other Ambulatory Visit: Payer: Self-pay

## 2022-03-14 ENCOUNTER — Ambulatory Visit (INDEPENDENT_AMBULATORY_CARE_PROVIDER_SITE_OTHER): Payer: 59 | Admitting: Nurse Practitioner

## 2022-03-14 ENCOUNTER — Encounter: Payer: Self-pay | Admitting: Nurse Practitioner

## 2022-03-14 VITALS — BP 134/72 | HR 73 | Temp 97.6°F

## 2022-03-14 DIAGNOSIS — M1A0710 Idiopathic chronic gout, right ankle and foot, without tophus (tophi): Secondary | ICD-10-CM | POA: Diagnosis not present

## 2022-03-14 MED ORDER — PREDNISONE 10 MG PO TABS: 10.0000 mg | ORAL_TABLET | Freq: Every day | ORAL | 0 refills | Status: DC

## 2022-03-14 NOTE — Progress Notes (Signed)
? ?BP 134/72   Pulse 73   Temp 97.6 ?F (36.4 ?C) (Oral)   SpO2 98%   ? ?Subjective:  ? ? Patient ID: George Hawkins, male    DOB: 1961/04/06, 61 y.o.   MRN: [id number] ? ?HPI: ?George Hawkins is a 61 y.o. male ? ?Chief Complaint  ?Patient presents with  ? Foot Pain  ?  Pt states he has been having R foot pain for the past few days. States the pain starts in the top of his foot and moves up into his ankle. States he was splitting wood this weekend and had a piece that fell over onto his foot, states the pain didn't start until a day or 2 later.   ? ?GOUT ?Duration:days ?Right 1st metatarsophalangeal pain: yes ?Left 1st metatarsophalangeal pain: no ?Right knee pain: no ?Left knee pain: no ?Severity: moderate  ?Quality: sharp ?Swelling: yes ?Redness: yes ?Trauma:  log rolled on foot ?Recent dietary change or indiscretion: no ?Fevers: no ?Nausea/vomiting: no ?Aggravating factors: weight bearing ?Alleviating factors:  ?Status:  better ?Treatments attempted: non weight bearing, allopurinol, ibuprofen ? ? ?Relevant past medical, surgical, family and social history reviewed and updated as indicated. Interim medical history since our last visit reviewed. ?Allergies and medications reviewed and updated. ? ?Review of Systems  ?Musculoskeletal:   ?     Gout of right Foot.  ? ?Per HPI unless specifically indicated above ? ?   ?Objective:  ?  ?BP 134/72   Pulse 73   Temp 97.6 ?F (36.4 ?C) (Oral)   SpO2 98%   ?Wt Readings from Last 3 Encounters:  ?02/22/22 223 lb (101.2 kg)  ?08/23/21 220 lb (99.8 kg)  ?06/20/21 225 lb (102.1 kg)  ?  ?Physical Exam ?Vitals and nursing note reviewed.  ?Constitutional:   ?   General: He is not in acute distress. ?   Appearance: Normal appearance. He is not ill-appearing, toxic-appearing or diaphoretic.  ?HENT:  ?   Head: Normocephalic.  ?   Right Ear: External ear normal.  ?   Left Ear: External ear normal.  ?   Nose: Nose normal. No congestion or rhinorrhea.  ?   Mouth/Throat:  ?    Mouth: Mucous membranes are moist.  ?Eyes:  ?   General:     ?   Right eye: No discharge.     ?   Left eye: No discharge.  ?   Extraocular Movements: Extraocular movements intact.  ?   Conjunctiva/sclera: Conjunctivae normal.  ?   Pupils: Pupils are equal, round, and reactive to light.  ?Cardiovascular:  ?   Rate and Rhythm: Normal rate and regular rhythm.  ?   Heart sounds: No murmur heard. ?Pulmonary:  ?   Effort: Pulmonary effort is normal. No respiratory distress.  ?   Breath sounds: Normal breath sounds. No wheezing, rhonchi or rales.  ?Abdominal:  ?   General: Abdomen is flat. Bowel sounds are normal.  ?Musculoskeletal:  ?   Cervical back: Normal range of motion and neck supple.  ?   Right foot: Decreased range of motion. Swelling (swollen from ankle to foot 2+) and bony tenderness present.  ?   Comments: Using crutches to ambulate   ?Skin: ?   General: Skin is warm and dry.  ?   Capillary Refill: Capillary refill takes less than 2 seconds.  ?Neurological:  ?   General: No focal deficit present.  ?   Mental Status: He is alert and oriented to  person, place, and time.  ?Psychiatric:     ?   Mood and Affect: Mood normal.     ?   Behavior: Behavior normal.     ?   Thought Content: Thought content normal.     ?   Judgment: Judgment normal.  ? ? ?Results for orders placed or performed in visit on 02/22/22  ?Microalbumin, Urine Waived  ?Result Value Ref Range  ? Microalb, Ur Waived 10 0 - 19 mg/L  ? Creatinine, Urine Waived 50 10 - 300 mg/dL  ? Microalb/Creat Ratio <30 <30 mg/g  ?Comprehensive metabolic panel  ?Result Value Ref Range  ? Glucose 112 (H) 70 - 99 mg/dL  ? BUN 15 6 - 24 mg/dL  ? Creatinine, Ser 0.97 0.76 - 1.27 mg/dL  ? eGFR 90 >59 mL/min/1.73  ? BUN/Creatinine Ratio 15 9 - 20  ? Sodium 138 134 - 144 mmol/L  ? Potassium 4.3 3.5 - 5.2 mmol/L  ? Chloride 102 96 - 106 mmol/L  ? CO2 20 20 - 29 mmol/L  ? Calcium 8.8 8.7 - 10.2 mg/dL  ? Total Protein 7.0 6.0 - 8.5 g/dL  ? Albumin 4.8 3.8 - 4.9 g/dL  ?  Globulin, Total 2.2 1.5 - 4.5 g/dL  ? Albumin/Globulin Ratio 2.2 1.2 - 2.2  ? Bilirubin Total 0.9 0.0 - 1.2 mg/dL  ? Alkaline Phosphatase 90 44 - 121 IU/L  ? AST 37 0 - 40 IU/L  ? ALT 41 0 - 44 IU/L  ?CBC with Differential/Platelet  ?Result Value Ref Range  ? WBC 5.8 3.4 - 10.8 x10E3/uL  ? RBC 4.38 4.14 - 5.80 x10E6/uL  ? Hemoglobin 14.6 13.0 - 17.7 g/dL  ? Hematocrit 42.3 37.5 - 51.0 %  ? MCV 97 79 - 97 fL  ? MCH 33.3 (H) 26.6 - 33.0 pg  ? MCHC 34.5 31.5 - 35.7 g/dL  ? RDW 12.5 11.6 - 15.4 %  ? Platelets 144 (L) 150 - 450 x10E3/uL  ? Neutrophils 65 Not Estab. %  ? Lymphs 15 Not Estab. %  ? Monocytes 9 Not Estab. %  ? Eos 9 Not Estab. %  ? Basos 1 Not Estab. %  ? Neutrophils Absolute 3.8 1.4 - 7.0 x10E3/uL  ? Lymphocytes Absolute 0.9 0.7 - 3.1 x10E3/uL  ? Monocytes Absolute 0.5 0.1 - 0.9 x10E3/uL  ? EOS (ABSOLUTE) 0.5 (H) 0.0 - 0.4 x10E3/uL  ? Basophils Absolute 0.1 0.0 - 0.2 x10E3/uL  ? Immature Granulocytes 1 Not Estab. %  ? Immature Grans (Abs) 0.0 0.0 - 0.1 x10E3/uL  ?Lipid Panel w/o Chol/HDL Ratio  ?Result Value Ref Range  ? Cholesterol, Total 176 100 - 199 mg/dL  ? Triglycerides 256 (H) 0 - 149 mg/dL  ? HDL 55 >39 mg/dL  ? VLDL Cholesterol Cal 42 (H) 5 - 40 mg/dL  ? LDL Chol Calc (NIH) 79 0 - 99 mg/dL  ?TSH  ?Result Value Ref Range  ? TSH 9.220 (H) 0.450 - 4.500 uIU/mL  ?PSA  ?Result Value Ref Range  ? Prostate Specific Ag, Serum 0.2 0.0 - 4.0 ng/mL  ?T4, free  ?Result Value Ref Range  ? Free T4 1.17 0.82 - 1.77 ng/dL  ?Thyroid peroxidase antibody  ?Result Value Ref Range  ? Thyroperoxidase Ab SerPl-aCnc 20 0 - 34 IU/mL  ?Uric acid  ?Result Value Ref Range  ? Uric Acid 11.6 (H) 3.8 - 8.4 mg/dL  ? ?   ?Assessment & Plan:  ? ?Problem List Items Addressed This Visit   ? ?  ?  Other  ? Chronic gout - Primary  ?  Acute flare.  Likely due to recently starting Allopurinol and log falling on patient's foot.  Symptoms are improving.  He was not able to put weight on foot at all yesterday without pain.  Patient has  significantly swelling in foot.  Will treat with Prednisone taper x12 days.  Follow up if symptoms worsen or fail to improve. ?  ?  ?  ? ?Follow up plan: ?Return if symptoms worsen or fail to improve. ? ? ? ? ? ?

## 2022-03-14 NOTE — Telephone Encounter (Signed)
Pt scheduled today with Santiago Glad ?

## 2022-03-14 NOTE — Assessment & Plan Note (Signed)
Acute flare.  Likely due to recently starting Allopurinol and log falling on patient's foot.  Symptoms are improving.  He was not able to put weight on foot at all yesterday without pain.  Patient has significantly swelling in foot.  Will treat with Prednisone taper x12 days.  Follow up if symptoms worsen or fail to improve. ?

## 2022-03-25 ENCOUNTER — Other Ambulatory Visit: Payer: 59

## 2022-03-25 DIAGNOSIS — E039 Hypothyroidism, unspecified: Secondary | ICD-10-CM | POA: Diagnosis not present

## 2022-03-25 DIAGNOSIS — M1A0710 Idiopathic chronic gout, right ankle and foot, without tophus (tophi): Secondary | ICD-10-CM

## 2022-03-26 ENCOUNTER — Other Ambulatory Visit: Payer: Self-pay | Admitting: Nurse Practitioner

## 2022-03-26 ENCOUNTER — Other Ambulatory Visit (HOSPITAL_COMMUNITY): Payer: Self-pay

## 2022-03-26 LAB — TSH: TSH: 3.94 u[IU]/mL (ref 0.450–4.500)

## 2022-03-26 LAB — URIC ACID: Uric Acid: 8.4 mg/dL (ref 3.8–8.4)

## 2022-03-26 LAB — T4, FREE: Free T4: 1.47 ng/dL (ref 0.82–1.77)

## 2022-03-26 MED ORDER — ALLOPURINOL 100 MG PO TABS
200.0000 mg | ORAL_TABLET | Freq: Every day | ORAL | 4 refills | Status: DC
  Filled 2022-03-26 – 2022-03-27 (×2): qty 180, 90d supply, fill #0
  Filled 2022-08-12: qty 180, 90d supply, fill #1
  Filled 2022-11-06: qty 180, 90d supply, fill #2
  Filled 2023-02-04: qty 180, 90d supply, fill #3

## 2022-03-26 NOTE — Progress Notes (Signed)
Contacted via [place] ? ? ?Good afternoon George Hawkins, your labs have returned.  Thyroid levels now in normal range, continue current Levothyroxine dosing (150 MCG).  Uric acid level is coming down, but would like to see <6.  Please increase Allopurinol to 200 MG (2 tablets) daily and we will recheck next visit or if another gout flare presents.  I have sent in new prescription for this.  Any questions? ?Keep being amazing!!  Thank you for allowing me to participate in your care.  I appreciate you. ?Kindest regards, ?Keilynn Marano ?

## 2022-03-27 ENCOUNTER — Other Ambulatory Visit (HOSPITAL_COMMUNITY): Payer: Self-pay

## 2022-03-31 NOTE — Progress Notes (Signed)
Contacted via MyChart ?? ?Contact via MyChart, but did not view -- please call with below. ? ?Good afternoon Rodman Key, your labs have returned. ?Thyroid levels now in normal range, continue current Levothyroxine dosing (150 MCG). ?Uric acid level is coming down, but would like to see <6. ?Please increase Allopurinol to 200 MG (2 tablets) daily and we will recheck next visit or if another gout flare presents. ?I have sent in new prescription for this. ?Any questions? ?Keep being amazing!! ?Thank you for allowing me to participate in your care. ?I appreciate you. ?Kindest regards, ?Virgle Arth

## 2022-04-01 ENCOUNTER — Other Ambulatory Visit (HOSPITAL_COMMUNITY): Payer: Self-pay

## 2022-04-03 ENCOUNTER — Other Ambulatory Visit (HOSPITAL_COMMUNITY): Payer: Self-pay

## 2022-04-18 ENCOUNTER — Other Ambulatory Visit (HOSPITAL_COMMUNITY): Payer: Self-pay

## 2022-05-13 ENCOUNTER — Other Ambulatory Visit (HOSPITAL_COMMUNITY): Payer: Self-pay

## 2022-05-21 ENCOUNTER — Other Ambulatory Visit (HOSPITAL_COMMUNITY): Payer: Self-pay

## 2022-08-01 ENCOUNTER — Other Ambulatory Visit (HOSPITAL_COMMUNITY): Payer: Self-pay

## 2022-08-12 ENCOUNTER — Other Ambulatory Visit (HOSPITAL_COMMUNITY): Payer: Self-pay

## 2022-08-15 DIAGNOSIS — D171 Benign lipomatous neoplasm of skin and subcutaneous tissue of trunk: Secondary | ICD-10-CM | POA: Diagnosis not present

## 2022-08-15 DIAGNOSIS — L918 Other hypertrophic disorders of the skin: Secondary | ICD-10-CM | POA: Diagnosis not present

## 2022-08-19 ENCOUNTER — Other Ambulatory Visit (HOSPITAL_COMMUNITY): Payer: Self-pay

## 2022-08-20 ENCOUNTER — Other Ambulatory Visit (HOSPITAL_COMMUNITY): Payer: Self-pay

## 2022-08-25 NOTE — Patient Instructions (Signed)
Low-Purine Eating Plan A low-purine eating plan involves making food choices to limit your purine intake. Purine is a kind of uric acid. Too much uric acid in your blood can cause certain conditions, such as gout and kidney stones. Eating a low-purine diet may help control these conditions. What are tips for following this plan? Shopping Avoid buying products that contain high-fructose corn syrup. Check for this on food labels. It is commonly found in many processed foods and soft drinks. Be sure to check for it in baked goods such as cookies, canned fruits, and cereals and cereal bars. Avoid buying veal, chicken breast with skin, lamb, and organ meats such as liver. These types of meats tend to have the highest purine content. Choose dairy products. These may lower uric acid levels. Avoid certain types of fish. Not all fish and seafood have high purine content. Examples with high purine content include anchovies, trout, tuna, sardines, and salmon. Avoid buying beverages that contain alcohol, particularly beer and hard liquor. Alcohol can affect the way your body gets rid of uric acid. Meal planning  Learn which foods do or do not affect you. If you find out that a food tends to cause your gout symptoms to flare up, avoid eating that food. You can enjoy foods that do not cause problems. If you have any questions about a food item, talk with your dietitian or health care provider. Reduce the overall amount of meat in your diet. When you do eat meat, choose ones with lower purine content. Include plenty of fruits and vegetables. Although some vegetables may have a high purine content--such as asparagus, mushrooms, spinach, or cauliflower--it has been shown that these do not contribute to uric acid blood levels as much. Consume at least 1 dairy serving a day. This has been shown to decrease uric acid levels. General information If you drink alcohol: Limit how much you have to: 0-1 drink a day for  women who are not pregnant. 0-2 drinks a day for men. Know how much alcohol is in a drink. In the [place] one drink equals one 12 oz bottle of beer (355 mL), one 5 oz glass of wine (148 mL), or one 1 oz glass of hard liquor (44 mL). Drink plenty of water. Try to drink enough to keep your urine pale yellow. Fluids can help remove uric acid from your body. Work with your health care provider and dietitian to develop a plan to achieve or maintain a healthy weight. Losing weight may help reduce uric acid in your blood. What foods are recommended? The following are some types of foods that are good choices when limiting purine intake: Fresh or frozen fruits and vegetables. Whole grains, breads, cereals, and pasta. Rice. Beans, peas, legumes. Nuts and seeds. Dairy products. Fats and oils. The items listed above may not be a complete list. Talk with a dietitian about what dietary choices are best for you. What foods are not recommended? Limit your intake of foods high in purines, including: Beer and other alcohol. Meat-based gravy or sauce. Canned or fresh fish, such as: Anchovies, sardines, herring, salmon, and tuna. Mussels and scallops. Codfish, trout, and haddock. Bacon, veal, chicken breast with skin, and lamb. Organ meats, such as: Liver or kidney. Tripe. Sweetbreads (thymus gland or pancreas). Wild game or goose. Yeast or yeast extract supplements. Drinks sweetened with high-fructose corn syrup, such as soda. Processed foods made with high-fructose corn syrup. The items listed above may not be a complete list of foods   and beverages you should limit. Contact a dietitian for more information. Summary Eating a low-purine diet may help control conditions caused by too much uric acid in the body, such as gout or kidney stones. Choose low-purine foods, limit alcohol, and limit high-fructose corn syrup. You will learn over time which foods do or do not affect you. If you find out that a  food tends to cause your gout symptoms to flare up, avoid eating that food. This information is not intended to replace advice given to you by your health care provider. Make sure you discuss any questions you have with your health care provider. Document Revised: 11/22/2021 Document Reviewed: 11/22/2021 Elsevier Patient Education  2023 [employer] Inc.  

## 2022-08-27 ENCOUNTER — Ambulatory Visit: Payer: 59 | Admitting: Nurse Practitioner

## 2022-08-27 ENCOUNTER — Other Ambulatory Visit (HOSPITAL_COMMUNITY): Payer: Self-pay

## 2022-08-27 ENCOUNTER — Encounter: Payer: Self-pay | Admitting: Nurse Practitioner

## 2022-08-27 VITALS — BP 135/76 | HR 63 | Temp 97.7°F | Ht 71.5 in | Wt 222.7 lb

## 2022-08-27 DIAGNOSIS — M1A0710 Idiopathic chronic gout, right ankle and foot, without tophus (tophi): Secondary | ICD-10-CM

## 2022-08-27 DIAGNOSIS — E6609 Other obesity due to excess calories: Secondary | ICD-10-CM

## 2022-08-27 DIAGNOSIS — E781 Pure hyperglyceridemia: Secondary | ICD-10-CM | POA: Diagnosis not present

## 2022-08-27 DIAGNOSIS — E039 Hypothyroidism, unspecified: Secondary | ICD-10-CM

## 2022-08-27 DIAGNOSIS — Z6830 Body mass index (BMI) 30.0-30.9, adult: Secondary | ICD-10-CM

## 2022-08-27 DIAGNOSIS — I10 Essential (primary) hypertension: Secondary | ICD-10-CM

## 2022-08-27 MED ORDER — INDOMETHACIN 50 MG PO CAPS
50.0000 mg | ORAL_CAPSULE | Freq: Three times a day (TID) | ORAL | 0 refills | Status: DC
  Filled 2022-08-27: qty 90, 30d supply, fill #0

## 2022-08-27 NOTE — Assessment & Plan Note (Signed)
Chronic, stable with Allopurinol on board, no further flares.  Will send in refills on Indocin to use as needed if flare presents.  Check uric acid level today. 

## 2022-08-27 NOTE — Progress Notes (Signed)
BP 135/76   Pulse 63   Temp 97.7 F (36.5 C) (Oral)   Ht 5' 11.5" (1.816 m)   Wt 222 lb 11.2 oz (101 kg)   SpO2 96%   BMI 30.63 kg/m    Subjective:    Patient ID: George Hawkins, male    DOB: December 26, 1960, 61 y.o.   MRN: [id number]  HPI: George Hawkins is a 61 y.o. male  Chief Complaint  Patient presents with   Hyperlipidemia   Hypertension   Hypothyroidism   HYPERTENSION / HYPERLIPIDEMIA Continues on Atenolol and Lipitor daily.   Satisfied with current treatment? yes Duration of hypertension: chronic BP monitoring frequency: not checking BP range:  BP medication side effects: no Duration of hyperlipidemia: chronic Cholesterol medication side effects: no Cholesterol supplements: fish oil  Medication compliance: good compliance Aspirin: yes Recent stressors: no Recurrent headaches: no Visual changes: no Palpitations: no Dyspnea: no Chest pain: no Lower extremity edema: no Dizzy/lightheaded: no   GOUT Continues on Allopurinol with benefit, no recent gout flares.  Would like refills on Indocin just in case flare presents. Duration:chronic Right 1st metatarsophalangeal pain: yes Left 1st metatarsophalangeal pain: no Right knee pain: no Left knee pain: no Recent dietary change or indiscretion: no Fevers: no Nausea/vomiting: no Aggravating factors: diet changes Alleviating factors: Allopurinol Status:  stable Treatments attempted: Allopurinol   HYPOTHYROIDISM Continues Levothyroxine 150 MCG daily. Thyroid control status:stable Satisfied with current treatment? yes Medication side effects: no Medication compliance: good compliance Etiology of hypothyroidism:  Recent dose adjustment:no Fatigue: no Cold intolerance: no Heat intolerance: no Weight gain: no Weight loss: no Constipation: no Diarrhea/loose stools: no Palpitations: no Lower extremity edema: no Anxiety/depressed mood: no    Relevant past medical, surgical, family and social  history reviewed and updated as indicated. Interim medical history since our last visit reviewed. Allergies and medications reviewed and updated.  Review of Systems  Constitutional:  Negative for activity change, diaphoresis, fatigue and fever.  Respiratory:  Negative for cough, chest tightness, shortness of breath and wheezing.   Cardiovascular:  Negative for chest pain, palpitations and leg swelling.  Gastrointestinal: Negative.   Endocrine: Negative for cold intolerance and heat intolerance.  Neurological: Negative.   Psychiatric/Behavioral: Negative.      Per HPI unless specifically indicated above     Objective:    BP 135/76   Pulse 63   Temp 97.7 F (36.5 C) (Oral)   Ht 5' 11.5" (1.816 m)   Wt 222 lb 11.2 oz (101 kg)   SpO2 96%   BMI 30.63 kg/m   Wt Readings from Last 3 Encounters:  08/27/22 222 lb 11.2 oz (101 kg)  02/22/22 223 lb (101.2 kg)  08/23/21 220 lb (99.8 kg)    Physical Exam Vitals and nursing note reviewed.  Constitutional:      General: He is awake. He is not in acute distress.    Appearance: He is well-developed and well-groomed. He is obese. He is not ill-appearing.  HENT:     Head: Normocephalic and atraumatic.     Right Ear: Hearing normal. No drainage.     Left Ear: Hearing normal. No drainage.  Eyes:     General: Lids are normal.        Right eye: No discharge.        Left eye: No discharge.     Conjunctiva/sclera: Conjunctivae normal.     Pupils: Pupils are equal, round, and reactive to light.  Neck:     Thyroid:  No thyromegaly.     Vascular: No carotid bruit.     Trachea: Trachea normal.  Cardiovascular:     Rate and Rhythm: Normal rate and regular rhythm.     Heart sounds: Normal heart sounds, S1 normal and S2 normal. No murmur heard.    No gallop.  Pulmonary:     Effort: Pulmonary effort is normal. No accessory muscle usage or respiratory distress.     Breath sounds: Normal breath sounds.  Abdominal:     General: Bowel sounds are  normal.     Palpations: Abdomen is soft.  Musculoskeletal:        General: Normal range of motion.     Cervical back: Normal range of motion and neck supple.     Right lower leg: No edema.     Left lower leg: No edema.  Lymphadenopathy:     Cervical: No cervical adenopathy.  Skin:    General: Skin is warm and dry.     Capillary Refill: Capillary refill takes less than 2 seconds.  Neurological:     Mental Status: He is alert and oriented to person, place, and time.  Psychiatric:        Attention and Perception: Attention normal.        Mood and Affect: Mood normal.        Speech: Speech normal.        Behavior: Behavior normal. Behavior is cooperative.        Thought Content: Thought content normal.    Results for orders placed or performed in visit on 03/25/22  Uric acid  Result Value Ref Range   Uric Acid 8.4 3.8 - 8.4 mg/dL  TSH  Result Value Ref Range   TSH 3.940 0.450 - 4.500 uIU/mL  T4, free  Result Value Ref Range   Free T4 1.47 0.82 - 1.77 ng/dL      Assessment & Plan:   Problem List Items Addressed This Visit       Cardiovascular and Mediastinum   Hypertension - Primary    Chronic, stable with BP at goal in office today.  Continue current medication regimen and adjust as needed.  Refills sent in.  Recommend obtaining BP cuff and checking BP three mornings a week at home + focus on DASH diet.  LABS: BMP.  Return in 6 months.      Relevant Orders   Basic metabolic panel     Endocrine   Hypothyroidism    Chronic, ongoing, stable TSH last check -- prior had been elevated.  Continue Levothyroxine at 150 MCG daily and adjust as needed.  Refills sent up to date.  Free T4 and TSH today.  Return in 6 months.      Relevant Orders   T4, free   TSH     Other   Chronic gout    Chronic, stable with Allopurinol on board, no further flares.  Will send in refills on Indocin to use as needed if flare presents.  Check uric acid level today.      Relevant Medications    indomethacin (INDOCIN) 50 MG capsule   Other Relevant Orders   Uric acid   Hyperlipidemia    Chronic, ongoing.  Continue current medication regimen and adjust as needed.  Lipid panel today.  Return in 6 months.      Relevant Orders   Lipid Panel w/o Chol/HDL Ratio   Obesity    BMI 30.63.  Recommended eating smaller high protein, low fat meals  more frequently and exercising 30 mins a day 5 times a week with a goal of 10-15lb weight loss in the next 3 months. Patient voiced their understanding and motivation to adhere to these recommendations.         Follow up plan: Return in about 6 months (around 02/25/2023) for Annual physical after 02/23/23.

## 2022-08-27 NOTE — Assessment & Plan Note (Signed)
BMI 30.63.  Recommended eating smaller high protein, low fat meals more frequently and exercising 30 mins a day 5 times a week with a goal of 10-15lb weight loss in the next 3 months. Patient voiced their understanding and motivation to adhere to these recommendations.

## 2022-08-27 NOTE — Assessment & Plan Note (Signed)
Chronic, ongoing, stable TSH last check -- prior had been elevated.  Continue Levothyroxine at 150 MCG daily and adjust as needed.  Refills sent up to date.  Free T4 and TSH today.  Return in 6 months.

## 2022-08-27 NOTE — Assessment & Plan Note (Signed)
Chronic, ongoing.  Continue current medication regimen and adjust as needed.  Lipid panel today.  Return in 6 months. 

## 2022-08-27 NOTE — Assessment & Plan Note (Signed)
Chronic, stable with BP at goal in office today.  Continue current medication regimen and adjust as needed.  Refills sent in.  Recommend obtaining BP cuff and checking BP three mornings a week at home + focus on DASH diet.  LABS: BMP.  Return in 6 months.

## 2022-08-28 ENCOUNTER — Encounter: Payer: Self-pay | Admitting: Nurse Practitioner

## 2022-08-28 LAB — LIPID PANEL W/O CHOL/HDL RATIO
Cholesterol, Total: 191 mg/dL (ref 100–199)
HDL: 35 mg/dL — ABNORMAL LOW (ref >39)
LDL Chol Calc (NIH): 55 mg/dL (ref 0–99)
Triglycerides: 684 mg/dL (ref 0–149)
VLDL Cholesterol Cal: 101 mg/dL — ABNORMAL HIGH (ref 5–40)

## 2022-08-28 LAB — BASIC METABOLIC PANEL
BUN/Creatinine Ratio: 19 (ref 9–20)
BUN: 23 mg/dL (ref 6–24)
CO2: 19 mmol/L — ABNORMAL LOW (ref 20–29)
Calcium: 9.3 mg/dL (ref 8.7–10.2)
Chloride: 100 mmol/L (ref 96–106)
Creatinine, Ser: 1.23 mg/dL (ref 0.76–1.27)
Glucose: 100 mg/dL — ABNORMAL HIGH (ref 70–99)
Potassium: 4.5 mmol/L (ref 3.5–5.2)
Sodium: 135 mmol/L (ref 134–144)
eGFR: 68 mL/min/{1.73_m2} (ref >59)

## 2022-08-28 LAB — URIC ACID: Uric Acid: 7.4 mg/dL (ref 3.8–8.4)

## 2022-08-28 LAB — T4, FREE: Free T4: 1.12 ng/dL (ref 0.82–1.77)

## 2022-08-28 LAB — TSH: TSH: 2.64 u[IU]/mL (ref 0.450–4.500)

## 2022-08-28 NOTE — Progress Notes (Signed)
Contacted via [place] afternoon George Hawkins, your labs have returned: - Cholesterol levels, I assume you were not fasting, correct?  Let me know.  If you were fasting we may need to make some changes as triglycerides are high.   - Kidney function, creatinine and eGFR, remains normal. - Thyroid levels remain stable, continue current Levothyroxine dosing.   - Uric acid level is stable.  Any questions? Keep being amazing!!  Thank you for allowing me to participate in your care.  I appreciate you. Kindest regards, Sabena Winner

## 2022-08-29 ENCOUNTER — Other Ambulatory Visit (HOSPITAL_COMMUNITY): Payer: Self-pay

## 2022-08-29 MED ORDER — ATORVASTATIN CALCIUM 80 MG PO TABS
80.0000 mg | ORAL_TABLET | Freq: Every day | ORAL | 3 refills | Status: DC
  Filled 2022-08-29: qty 90, 90d supply, fill #0
  Filled 2022-10-14 – 2022-12-04 (×2): qty 90, 90d supply, fill #1

## 2022-10-14 ENCOUNTER — Other Ambulatory Visit (HOSPITAL_COMMUNITY): Payer: Self-pay

## 2022-10-25 ENCOUNTER — Other Ambulatory Visit (HOSPITAL_COMMUNITY): Payer: Self-pay

## 2022-11-06 ENCOUNTER — Other Ambulatory Visit (HOSPITAL_COMMUNITY): Payer: Self-pay

## 2023-02-04 ENCOUNTER — Other Ambulatory Visit: Payer: Self-pay | Admitting: Nurse Practitioner

## 2023-02-05 ENCOUNTER — Other Ambulatory Visit: Payer: Self-pay

## 2023-02-05 ENCOUNTER — Other Ambulatory Visit (HOSPITAL_COMMUNITY): Payer: Self-pay

## 2023-02-05 MED ORDER — TADALAFIL 10 MG PO TABS
10.0000 mg | ORAL_TABLET | Freq: Every day | ORAL | 5 refills | Status: DC | PRN
  Filled 2023-02-05: qty 10, 10d supply, fill #0

## 2023-02-05 NOTE — Telephone Encounter (Signed)
Requested medication (s) are due for refill today- expired Rx  Requested medication (s) are on the active medication list -yes  Future visit scheduled -yes  Last refill: 08/14/21 #10 5RF  Notes to clinic: expired Rx  Requested Prescriptions  Pending Prescriptions Disp Refills   tadalafil (CIALIS) 10 MG tablet 10 tablet 5    Sig: Take 1 tablet (10 mg total) by mouth daily as needed for erectile dysfunction. Take 30 minutes prior to anticipated sexual intercourse.  Do not take more than one dose daily.     Urology: Erectile Dysfunction Agents Passed - 02/04/2023  9:26 PM      Passed - AST in normal range and within 360 days    AST  Date Value Ref Range Status  02/22/2022 37 0 - 40 IU/L Final         Passed - ALT in normal range and within 360 days    ALT  Date Value Ref Range Status  02/22/2022 41 0 - 44 IU/L Final         Passed - Last BP in normal range    BP Readings from Last 1 Encounters:  08/27/22 135/76         Passed - Valid encounter within last 12 months    Recent Outpatient Visits           5 months ago Primary hypertension   [place], [place] T, NP   10 months ago Chronic idiopathic gout involving toe of right foot without tophus   [place] Jon [place], NP   11 months ago Primary hypertension   [place], [place] T, NP   1 year ago Primary hypertension   [place], [place] T, NP   1 year ago Acute pain of left knee   [place], Henrine Screws T, NP       Future Appointments             In 2 weeks Cannady, Barbaraann Faster, NP [place], [facility]               Requested Prescriptions  Pending Prescriptions Disp Refills   tadalafil (CIALIS) 10 MG tablet 10 tablet 5    Sig: Take 1 tablet (10 mg total) by mouth daily as needed for erectile dysfunction. Take 30 minutes  prior to anticipated sexual intercourse.  Do not take more than one dose daily.     Urology: Erectile Dysfunction Agents Passed - 02/04/2023  9:26 PM      Passed - AST in normal range and within 360 days    AST  Date Value Ref Range Status  02/22/2022 37 0 - 40 IU/L Final         Passed - ALT in normal range and within 360 days    ALT  Date Value Ref Range Status  02/22/2022 41 0 - 44 IU/L Final         Passed - Last BP in normal range    BP Readings from Last 1 Encounters:  08/27/22 135/76         Passed - Valid encounter within last 12 months    Recent Outpatient Visits           5 months ago Primary hypertension   [place], [place] T, NP   10 months ago Chronic idiopathic gout involving toe of  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

## 2023-02-22 NOTE — Patient Instructions (Signed)

## 2023-02-25 ENCOUNTER — Ambulatory Visit (INDEPENDENT_AMBULATORY_CARE_PROVIDER_SITE_OTHER): Payer: Commercial Managed Care - PPO | Admitting: Nurse Practitioner

## 2023-02-25 ENCOUNTER — Encounter: Payer: Self-pay | Admitting: Nurse Practitioner

## 2023-02-25 ENCOUNTER — Other Ambulatory Visit (HOSPITAL_COMMUNITY): Payer: Self-pay

## 2023-02-25 VITALS — BP 131/78 | HR 69 | Temp 97.9°F | Ht 71.5 in | Wt 219.4 lb

## 2023-02-25 DIAGNOSIS — E781 Pure hyperglyceridemia: Secondary | ICD-10-CM | POA: Diagnosis not present

## 2023-02-25 DIAGNOSIS — E6609 Other obesity due to excess calories: Secondary | ICD-10-CM

## 2023-02-25 DIAGNOSIS — Z6830 Body mass index (BMI) 30.0-30.9, adult: Secondary | ICD-10-CM | POA: Diagnosis not present

## 2023-02-25 DIAGNOSIS — I10 Essential (primary) hypertension: Secondary | ICD-10-CM | POA: Diagnosis not present

## 2023-02-25 DIAGNOSIS — E039 Hypothyroidism, unspecified: Secondary | ICD-10-CM | POA: Diagnosis not present

## 2023-02-25 DIAGNOSIS — M1A0710 Idiopathic chronic gout, right ankle and foot, without tophus (tophi): Secondary | ICD-10-CM | POA: Diagnosis not present

## 2023-02-25 DIAGNOSIS — Z0000 Encounter for general adult medical examination without abnormal findings: Secondary | ICD-10-CM

## 2023-02-25 DIAGNOSIS — N522 Drug-induced erectile dysfunction: Secondary | ICD-10-CM

## 2023-02-25 LAB — MICROALBUMIN, URINE WAIVED
Creatinine, Urine Waived: 50 mg/dL (ref 10–300)
Microalb, Ur Waived: 10 mg/L (ref 0–19)
Microalb/Creat Ratio: <30 mg/g (ref <30)

## 2023-02-25 MED ORDER — ALLOPURINOL 100 MG PO TABS
200.0000 mg | ORAL_TABLET | Freq: Every day | ORAL | 4 refills | Status: DC
  Filled 2023-02-25 – 2023-05-07 (×2): qty 180, 90d supply, fill #0
  Filled 2023-09-01: qty 180, 90d supply, fill #1
  Filled 2023-12-04: qty 180, 90d supply, fill #2

## 2023-02-25 MED ORDER — TADALAFIL 10 MG PO TABS
10.0000 mg | ORAL_TABLET | Freq: Every day | ORAL | 5 refills | Status: DC | PRN
  Filled 2023-02-25 – 2023-05-28 (×2): qty 10, 10d supply, fill #0
  Filled 2023-08-04 – 2023-08-05 (×2): qty 10, 10d supply, fill #1
  Filled 2024-02-14: qty 10, 10d supply, fill #2

## 2023-02-25 MED ORDER — LEVOTHYROXINE SODIUM 150 MCG PO TABS
150.0000 ug | ORAL_TABLET | Freq: Every day | ORAL | 4 refills | Status: DC
  Filled 2023-02-25 – 2023-05-13 (×2): qty 90, 90d supply, fill #0
  Filled 2023-08-04 – 2023-08-05 (×2): qty 90, 90d supply, fill #1
  Filled 2023-11-17: qty 90, 90d supply, fill #2
  Filled 2024-02-14: qty 90, 90d supply, fill #3

## 2023-02-25 MED ORDER — ATENOLOL 100 MG PO TABS
100.0000 mg | ORAL_TABLET | Freq: Every day | ORAL | 4 refills | Status: DC
  Filled 2023-02-25: qty 90, fill #0
  Filled 2023-05-07: qty 90, 90d supply, fill #0
  Filled 2023-08-25: qty 90, 90d supply, fill #1
  Filled 2023-11-27: qty 90, 90d supply, fill #2

## 2023-02-25 MED ORDER — ATORVASTATIN CALCIUM 80 MG PO TABS
80.0000 mg | ORAL_TABLET | Freq: Every day | ORAL | 4 refills | Status: DC
  Filled 2023-02-25: qty 90, 90d supply, fill #0
  Filled 2023-05-28: qty 90, 90d supply, fill #1
  Filled 2023-09-01: qty 90, 90d supply, fill #2
  Filled 2023-12-18: qty 90, 90d supply, fill #3

## 2023-02-25 NOTE — Assessment & Plan Note (Signed)
Chronic, ongoing.  Continue current medication regimen and adjust as needed.  Lipid panel today.  Return in 6 months.

## 2023-02-25 NOTE — Assessment & Plan Note (Signed)
Chronic, stable with Allopurinol on board, no further flares.  Will send in refills on Indocin to use as needed if flare presents.  Check uric acid level today.

## 2023-02-25 NOTE — Assessment & Plan Note (Addendum)
Chronic, stable.  BP at goal in office today.  Continue current medication regimen and adjust as needed.  Refills sent in.  Recommend obtaining BP cuff and checking BP three mornings a week at home + focus on DASH diet.  LABS: CBC, CMP, TSH, urine ALB. Urine ALB 02 March 2023.  Return in 6 months.

## 2023-02-25 NOTE — Assessment & Plan Note (Signed)
Stable with addition of Cialis.  Will send in refills and adjust regimen as needed.

## 2023-02-25 NOTE — Assessment & Plan Note (Signed)
Chronic, ongoing, stable TSH last check.  Continue Levothyroxine at 150 MCG daily and adjust as needed.  Refills sent.  Free T4 and TSH today.  Return in 6 months.

## 2023-02-25 NOTE — Progress Notes (Signed)
BP 131/78   Pulse 69   Temp 97.9 F (36.6 C) (Oral)   Ht 5' 11.5" (1.816 m)   Wt 219 lb 6.4 oz (99.5 kg)   SpO2 99%   BMI 30.18 kg/m    Subjective:    Patient ID: George Hawkins, male    DOB: May 02, 1961, 62 y.o.   MRN: [id number]  HPI: George Hawkins is a 62 y.o. male presenting on 02/25/2023 for comprehensive medical examination. Current medical complaints include:none  He currently lives with: wife Interim Problems from his last visit: no   HYPERTENSION / HYPERLIPIDEMIA Currently on Atenolol 100 MG daily and Lipitor 80 MG daily.    He has a history of gout, last flare 03/14/22 -- taking Allopurinol 200 MG daily.  No recent flares. Satisfied with current treatment? yes Duration of hypertension: chronic BP monitoring frequency: not checking BP range:  BP medication side effects: no Duration of hyperlipidemia: chronic Cholesterol medication side effects: no Cholesterol supplements: fish oil  Medication compliance: good compliance Aspirin: yes Recent stressors: no Recurrent headaches: no Visual changes: no Palpitations: no Dyspnea: no Chest pain: no Lower extremity edema: no Dizzy/lightheaded: no  The 10-year ASCVD risk score (Arnett DK, et al., 2019) is: 12%   Values used to calculate the score:     Age: 67 years     Sex: Male     Is Non-Hispanic African American: No     Diabetic: No     Tobacco smoker: No     Systolic Blood Pressure: [id number] mmHg     Is BP treated: Yes     HDL Cholesterol: 35 mg/dL     Total Cholesterol: 191 mg/dL   HYPOTHYROIDISM Currently taking Levothyroxine 150 MCG daily. Thyroid control status:stable Satisfied with current treatment? yes Medication side effects: no Medication compliance: good compliance Etiology of hypothyroidism:  Recent dose adjustment:no Fatigue: no Cold intolerance: no Heat intolerance: no Weight gain: no Weight loss: no Constipation: no Diarrhea/loose stools: no Palpitations: no Lower extremity edema:  no Anxiety/depressed mood: no   ERECTILE DYSFUNCTION Uses Cialis.  Can achieve erection, but has difficulty maintaining.  Taking Cialis without ADR.  Denies any family history of MI or CVA.  Functional Status Survey: Is the patient deaf or have difficulty hearing?: No Does the patient have difficulty seeing, even when wearing glasses/contacts?: No Does the patient have difficulty concentrating, remembering, or making decisions?: No Does the patient have difficulty walking or climbing stairs?: No Does the patient have difficulty dressing or bathing?: No Does the patient have difficulty doing errands alone such as visiting a doctor's office or shopping?: No  FALL RISK:    02/25/2023    8:54 AM 08/27/2022   10:44 AM 02/22/2022    9:22 AM 02/20/2021    9:55 AM 02/18/2019   10:45 AM  [place] in the past year? 0 0 0 0 0  Number falls in past yr: 0 0 0    Injury with Fall? 0 0 0 0   Risk for fall due to : No Fall Risks No Fall Risks No Fall Risks    Follow up Falls evaluation completed Falls evaluation completed Falls evaluation completed  Falls evaluation completed    Depression Screen    02/25/2023    8:55 AM 08/27/2022   10:44 AM 02/22/2022    9:24 AM 02/20/2021    9:55 AM 02/21/2020   10:14 AM  Depression screen PHQ 2/9  Decreased Interest 0 0 0 0  0  Down, Depressed, Hopeless 0 0 0 0 0  PHQ - 2 Score 0 0 0 0 0  Altered sleeping 0 0 0 0 0  Tired, decreased energy 0 0 0 0 0  Change in appetite 0 0 0 0 0  Feeling bad or failure about yourself  0 0 0 0 0  Trouble concentrating 0 0 0 0 0  Moving slowly or fidgety/restless 0 0 0 0 0  Suicidal thoughts 0 0 0 0 0  PHQ-9 Score 0 0 0 0 0  Difficult doing work/chores  Not difficult at all Not difficult at all  Not difficult at all      02/25/2023    8:55 AM 08/27/2022   10:44 AM 02/22/2022    9:25 AM  GAD 7 : Generalized Anxiety Score  Nervous, Anxious, on Edge 0 0 0  Control/stop worrying 0 0 0  Worry too much - different things 0 0 0   Trouble relaxing 0 0 0  Restless 0 0 0  Easily annoyed or irritable 0 0 0  Afraid - awful might happen 0 0 0  Total GAD 7 Score 0 0 0  Anxiety Difficulty Not difficult at all Not difficult at all Not difficult at all      Past Medical History:  Past Medical History:  Diagnosis Date   Gout    Hyperlipidemia    Hypertension    Hypothyroidism     Surgical History:  Past Surgical History:  Procedure Laterality Date   TENDON REPAIR Right    Hand   VASECTOMY      Medications:  Current Outpatient Medications on File Prior to Visit  Medication Sig   aspirin 81 MG chewable tablet Chew 81 mg by mouth daily.   Fish Oil OIL by Does not apply route.   indomethacin (INDOCIN) 50 MG capsule Take 1 capsule (50 mg total) by mouth 3 (three) times daily as needed   No current facility-administered medications on file prior to visit.    Allergies:  No Known Allergies  Social History:  Social History   Socioeconomic History   Marital status: Married    Spouse name: Not on file   Number of children: Not on file   Years of education: Not on file   Highest education level: Not on file  Occupational History   Not on file  Tobacco Use   Smoking status: Never   Smokeless tobacco: Never  Vaping Use   Vaping Use: Never used  Substance and Sexual Activity   Alcohol use: Yes    Alcohol/week: 14.0 standard drinks of alcohol    Types: 14 Cans of beer per week   Drug use: No   Sexual activity: Yes  Other Topics Concern   Not on file  Social History Narrative   Not on file   Social Determinants of Health   Financial Resource Strain: Not on file  Food Insecurity: Not on file  Transportation Needs: Not on file  Physical Activity: Not on file  Stress: Not on file  Social Connections: Not on file  Intimate Partner Violence: Not on file   Social History   Tobacco Use  Smoking Status Never  Smokeless Tobacco Never   Social History   Substance and Sexual Activity  Alcohol  Use Yes   Alcohol/week: 14.0 standard drinks of alcohol   Types: 14 Cans of beer per week    Family History:  Family History  Problem Relation Age of Onset  Thyroid disease Brother    Thyroid disease Sister    Thyroid disease Brother    Heart attack Neg Hx    Hyperlipidemia Neg Hx    Hypertension Neg Hx    Sudden death Neg Hx     Past medical history, surgical history, medications, allergies, family history and social history reviewed with patient today and changes made to appropriate areas of the chart.   Review of Systems - Negative All other ROS negative except what is listed above and in the HPI.      Objective:    BP 131/78   Pulse 69   Temp 97.9 F (36.6 C) (Oral)   Ht 5' 11.5" (1.816 m)   Wt 219 lb 6.4 oz (99.5 kg)   SpO2 99%   BMI 30.18 kg/m   Wt Readings from Last 3 Encounters:  02/25/23 219 lb 6.4 oz (99.5 kg)  08/27/22 222 lb 11.2 oz (101 kg)  02/22/22 223 lb (101.2 kg)    Physical Exam Vitals and nursing note reviewed.  Constitutional:      General: He is awake.     Appearance: He is well-developed.  HENT:     Head: Normocephalic and atraumatic.     Right Ear: Hearing, tympanic membrane, ear canal and external ear normal. No decreased hearing noted. No drainage.     Left Ear: Hearing, tympanic membrane, ear canal and external ear normal. No decreased hearing noted. No drainage.     Nose: Nose normal.     Right Sinus: No maxillary sinus tenderness or frontal sinus tenderness.     Left Sinus: No maxillary sinus tenderness or frontal sinus tenderness.     Mouth/Throat:     Pharynx: Uvula midline.  Eyes:     General: Lids are normal.        Right eye: No discharge.        Left eye: No discharge.     Conjunctiva/sclera: Conjunctivae normal.     Pupils: Pupils are equal, round, and reactive to light.     Visual Fields: Right eye visual fields normal and left eye visual fields normal.  Neck:     Thyroid: No thyromegaly.     Vascular: No carotid  bruit or JVD.     Trachea: Trachea normal.  Cardiovascular:     Rate and Rhythm: Normal rate and regular rhythm.     Heart sounds: Normal heart sounds, S1 normal and S2 normal. No murmur heard.    No gallop.  Pulmonary:     Effort: Pulmonary effort is normal.     Breath sounds: Normal breath sounds.  Abdominal:     General: Bowel sounds are normal.     Palpations: Abdomen is soft. There is no hepatomegaly or splenomegaly.  Genitourinary:    Penis: Normal.      Rectum: Normal.  Musculoskeletal:        General: Normal range of motion.     Cervical back: Normal range of motion and neck supple.     Right lower leg: No edema.     Left lower leg: No edema.  Lymphadenopathy:     Cervical: No cervical adenopathy.  Skin:    General: Skin is warm and dry.     Capillary Refill: Capillary refill takes less than 2 seconds.     Findings: No rash.  Neurological:     Mental Status: He is alert and oriented to person, place, and time.     Sensory: Sensation is intact.  Motor: Motor function is intact.     Coordination: Coordination is intact.     Gait: Gait is intact.     Deep Tendon Reflexes: Reflexes are normal and symmetric.     Reflex Scores:      Brachioradialis reflexes are 2+ on the right side and 2+ on the left side.      Patellar reflexes are 2+ on the right side and 2+ on the left side. Psychiatric:        Attention and Perception: Attention normal.        Mood and Affect: Mood normal.        Speech: Speech normal.        Behavior: Behavior normal. Behavior is cooperative.        Thought Content: Thought content normal.        Cognition and Memory: Cognition normal.        Judgment: Judgment normal.    Results for orders placed or performed in visit on 08/27/22  T4, free  Result Value Ref Range   Free T4 1.12 0.82 - 1.77 ng/dL  Basic metabolic panel  Result Value Ref Range   Glucose 100 (H) 70 - 99 mg/dL   BUN 23 6 - 24 mg/dL   Creatinine, Ser 1.23 0.76 - 1.27 mg/dL    eGFR 68 >59 mL/min/1.73   BUN/Creatinine Ratio 19 9 - 20   Sodium 135 134 - 144 mmol/L   Potassium 4.5 3.5 - 5.2 mmol/L   Chloride 100 96 - 106 mmol/L   CO2 19 (L) 20 - 29 mmol/L   Calcium 9.3 8.7 - 10.2 mg/dL  Lipid Panel w/o Chol/HDL Ratio  Result Value Ref Range   Cholesterol, Total 191 100 - 199 mg/dL   Triglycerides 684 (HH) 0 - 149 mg/dL   HDL 35 (L) >39 mg/dL   VLDL Cholesterol Cal 101 (H) 5 - 40 mg/dL   LDL Chol Calc (NIH) 55 0 - 99 mg/dL  TSH  Result Value Ref Range   TSH 2.640 0.450 - 4.500 uIU/mL  Uric acid  Result Value Ref Range   Uric Acid 7.4 3.8 - 8.4 mg/dL      Assessment & Plan:   Problem List Items Addressed This Visit       Cardiovascular and Mediastinum   Hypertension - Primary    Chronic, stable.  BP at goal in office today.  Continue current medication regimen and adjust as needed.  Refills sent in.  Recommend obtaining BP cuff and checking BP three mornings a week at home + focus on DASH diet.  LABS: CBC, CMP, TSH, urine ALB. Urine ALB 02 March 2023.  Return in 6 months.      Relevant Medications   tadalafil (CIALIS) 10 MG tablet   atorvastatin (LIPITOR) 80 MG tablet   atenolol (TENORMIN) 100 MG tablet   Other Relevant Orders   Microalbumin, Urine Waived   CBC with Differential/Platelet     Endocrine   Hypothyroidism    Chronic, ongoing, stable TSH last check.  Continue Levothyroxine at 150 MCG daily and adjust as needed.  Refills sent.  Free T4 and TSH today.  Return in 6 months.      Relevant Medications   levothyroxine (SYNTHROID) 150 MCG tablet   atenolol (TENORMIN) 100 MG tablet   Other Relevant Orders   TSH   T4, free     Other   Chronic gout    Chronic, stable with Allopurinol on board, no further flares.  Will send in refills on Indocin to use as needed if flare presents.  Check uric acid level today.      Relevant Orders   Uric acid   Erectile dysfunction    Stable with addition of Cialis.  Will send in refills and adjust  regimen as needed.      Relevant Orders   PSA   Hyperlipidemia    Chronic, ongoing.  Continue current medication regimen and adjust as needed.  Lipid panel today.  Return in 6 months.      Relevant Medications   tadalafil (CIALIS) 10 MG tablet   atorvastatin (LIPITOR) 80 MG tablet   atenolol (TENORMIN) 100 MG tablet   Other Relevant Orders   Comprehensive metabolic panel   Lipid Panel w/o Chol/HDL Ratio   Obesity    BMI 30.18  Recommended eating smaller high protein, low fat meals more frequently and exercising 30 mins a day 5 times a week with a goal of 10-15lb weight loss in the next 3 months. Patient voiced their understanding and motivation to adhere to these recommendations.       Other Visit Diagnoses     Encounter for annual physical exam       Annual physical today with labs and health maintenance reviewed, discussed with patient.       Discussed aspirin prophylaxis for myocardial infarction prevention and decision was made to continue ASA  LABORATORY TESTING:  Health maintenance labs ordered today as discussed above.   The natural history of prostate cancer and ongoing controversy regarding screening and potential treatment outcomes of prostate cancer has been discussed with the patient. The meaning of a false positive PSA and a false negative PSA has been discussed. He indicates understanding of the limitations of this screening test and wishes to proceed with screening PSA testing.   IMMUNIZATIONS:   - Tdap: Tetanus vaccination status reviewed: Up To Date - Influenza: Refused - Pneumovax: Not applicable - Prevnar: Not applicable - Zostavax vaccine: Refused  - Covid vaccines: x 2 obtained  SCREENING: - Colonoscopy: Up to date  Discussed with patient purpose of the colonoscopy is to detect colon cancer at curable precancerous or early stages   - AAA Screening: Not applicable  -Hearing Test: Not applicable  -Spirometry: Not applicable   PATIENT COUNSELING:     Sexuality: Discussed sexually transmitted diseases, partner selection, use of condoms, avoidance of unintended pregnancy  and contraceptive alternatives.   Advised to avoid cigarette smoking.  I discussed with the patient that most people either abstain from alcohol or drink within safe limits (<=14/week and <=4 drinks/occasion for males, <=7/weeks and <= 3 drinks/occasion for females) and that the risk for alcohol disorders and other health effects rises proportionally with the number of drinks per week and how often a drinker exceeds daily limits.  Discussed cessation/primary prevention of drug use and availability of treatment for abuse.   Diet: Encouraged to adjust caloric intake to maintain  or achieve ideal body weight, to reduce intake of dietary saturated fat and total fat, to limit sodium intake by avoiding high sodium foods and not adding table salt, and to maintain adequate dietary potassium and calcium preferably from fresh fruits, vegetables, and low-fat dairy products.    Stressed the importance of regular exercise  Injury prevention: Discussed safety belts, safety helmets, smoke detector, smoking near bedding or upholstery.   Dental health: Discussed importance of regular tooth brushing, flossing, and dental visits.   Follow up plan: NEXT PREVENTATIVE PHYSICAL DUE  IN 1 YEAR. Return in about 6 months (around 08/28/2023) for HTN/HLD, THYROID.

## 2023-02-25 NOTE — Assessment & Plan Note (Signed)
BMI 30.18  Recommended eating smaller high protein, low fat meals more frequently and exercising 30 mins a day 5 times a week with a goal of 10-15lb weight loss in the next 3 months. Patient voiced their understanding and motivation to adhere to these recommendations.

## 2023-02-26 ENCOUNTER — Other Ambulatory Visit: Payer: Self-pay | Admitting: Nurse Practitioner

## 2023-02-26 DIAGNOSIS — E039 Hypothyroidism, unspecified: Secondary | ICD-10-CM

## 2023-02-26 LAB — T4, FREE: Free T4: 1.17 ng/dL (ref 0.82–1.77)

## 2023-02-26 LAB — COMPREHENSIVE METABOLIC PANEL
ALT: 39 IU/L (ref 0–44)
AST: 36 IU/L (ref 0–40)
Albumin/Globulin Ratio: 2 (ref 1.2–2.2)
Albumin: 4.5 g/dL (ref 3.8–4.9)
Alkaline Phosphatase: 105 IU/L (ref 44–121)
BUN/Creatinine Ratio: 16 (ref 9–20)
BUN: 16 mg/dL (ref 6–24)
Bilirubin Total: 0.8 mg/dL (ref 0.0–1.2)
CO2: 21 mmol/L (ref 20–29)
Calcium: 9.2 mg/dL (ref 8.7–10.2)
Chloride: 104 mmol/L (ref 96–106)
Creatinine, Ser: 1 mg/dL (ref 0.76–1.27)
Globulin, Total: 2.3 g/dL (ref 1.5–4.5)
Glucose: 110 mg/dL — ABNORMAL HIGH (ref 70–99)
Potassium: 4.5 mmol/L (ref 3.5–5.2)
Sodium: 140 mmol/L (ref 134–144)
Total Protein: 6.8 g/dL (ref 6.0–8.5)
eGFR: 87 mL/min/{1.73_m2} (ref >59)

## 2023-02-26 LAB — LIPID PANEL W/O CHOL/HDL RATIO
Cholesterol, Total: 159 mg/dL (ref 100–199)
HDL: 52 mg/dL (ref >39)
LDL Chol Calc (NIH): 68 mg/dL (ref 0–99)
Triglycerides: 243 mg/dL — ABNORMAL HIGH (ref 0–149)
VLDL Cholesterol Cal: 39 mg/dL (ref 5–40)

## 2023-02-26 LAB — CBC WITH DIFFERENTIAL/PLATELET
Basophils Absolute: 0.1 10*3/uL (ref 0.0–0.2)
Basos: 1 %
EOS (ABSOLUTE): 0.5 10*3/uL — ABNORMAL HIGH (ref 0.0–0.4)
Eos: 9 %
Hematocrit: 40.4 % (ref 37.5–51.0)
Hemoglobin: 14.1 g/dL (ref 13.0–17.7)
Immature Grans (Abs): 0 10*3/uL (ref 0.0–0.1)
Immature Granulocytes: 1 %
Lymphocytes Absolute: 0.9 10*3/uL (ref 0.7–3.1)
Lymphs: 16 %
MCH: 33.2 pg — ABNORMAL HIGH (ref 26.6–33.0)
MCHC: 34.9 g/dL (ref 31.5–35.7)
MCV: 95 fL (ref 79–97)
Monocytes Absolute: 0.5 10*3/uL (ref 0.1–0.9)
Monocytes: 9 %
Neutrophils Absolute: 3.6 10*3/uL (ref 1.4–7.0)
Neutrophils: 64 %
Platelets: 148 10*3/uL — ABNORMAL LOW (ref 150–450)
RBC: 4.25 x10E6/uL (ref 4.14–5.80)
RDW: 11.9 % (ref 11.6–15.4)
WBC: 5.5 10*3/uL (ref 3.4–10.8)

## 2023-02-26 LAB — PSA: Prostate Specific Ag, Serum: 0.3 ng/mL (ref 0.0–4.0)

## 2023-02-26 LAB — TSH: TSH: 4.52 u[IU]/mL — ABNORMAL HIGH (ref 0.450–4.500)

## 2023-02-26 LAB — URIC ACID: Uric Acid: 6.9 mg/dL (ref 3.8–8.4)

## 2023-02-26 NOTE — Progress Notes (Signed)
Contacted via [facility] -- need lab only visit in 6 weeks please.   Good morning George Hawkins, your labs have returned: - CBC continues to show mildly low platelet count at 148, but no worsening.  We will continue to monitor.   - Kidney function, creatinine and eGFR, remains normal, as is liver function, AST and ALT.  - Cholesterol levels show stable LDL at 68, continue Atorvastatin daily + add on Omega 3 supplement to help lower triglycerides. - Uric acid and PSA normal. - Thyroid labs show mild elevation in TSH and normal Free T4, I would recommend we recheck labs in 6 weeks outpatient.  Ensure you are taking Levothyroxine every morning 30 minutes before other medications and foods.  If ongoing elevations I will then increase Levothyroxine dosing.  Any questions? Keep being awesome!!  Thank you for allowing me to participate in your care.  I appreciate you. Kindest regards, Jaice Lague

## 2023-04-09 ENCOUNTER — Other Ambulatory Visit: Payer: Commercial Managed Care - PPO

## 2023-04-09 DIAGNOSIS — E039 Hypothyroidism, unspecified: Secondary | ICD-10-CM | POA: Diagnosis not present

## 2023-04-10 LAB — T4, FREE: Free T4: 1.18 ng/dL (ref 0.82–1.77)

## 2023-04-10 LAB — TSH: TSH: 3.62 u[IU]/mL (ref 0.450–4.500)

## 2023-04-10 NOTE — Progress Notes (Signed)
Contacted via MyChart   Thyroid testing has returned within normal ranges, continue current Levothyroxine dosing:)

## 2023-05-08 ENCOUNTER — Other Ambulatory Visit (HOSPITAL_COMMUNITY): Payer: Self-pay

## 2023-05-13 ENCOUNTER — Other Ambulatory Visit (HOSPITAL_COMMUNITY): Payer: Self-pay

## 2023-05-28 ENCOUNTER — Other Ambulatory Visit: Payer: Self-pay

## 2023-05-28 ENCOUNTER — Other Ambulatory Visit (HOSPITAL_COMMUNITY): Payer: Self-pay

## 2023-05-29 ENCOUNTER — Other Ambulatory Visit (HOSPITAL_COMMUNITY): Payer: Self-pay

## 2023-05-30 ENCOUNTER — Other Ambulatory Visit (HOSPITAL_COMMUNITY): Payer: Self-pay

## 2023-08-05 ENCOUNTER — Other Ambulatory Visit (HOSPITAL_COMMUNITY): Payer: Self-pay

## 2023-08-25 NOTE — Patient Instructions (Signed)

## 2023-08-29 ENCOUNTER — Encounter: Payer: Self-pay | Admitting: Nurse Practitioner

## 2023-08-29 ENCOUNTER — Ambulatory Visit: Payer: Commercial Managed Care - PPO | Admitting: Nurse Practitioner

## 2023-08-29 VITALS — BP 132/72 | HR 66 | Temp 97.6°F | Ht 71.5 in | Wt 223.6 lb

## 2023-08-29 DIAGNOSIS — E6609 Other obesity due to excess calories: Secondary | ICD-10-CM

## 2023-08-29 DIAGNOSIS — I10 Essential (primary) hypertension: Secondary | ICD-10-CM | POA: Diagnosis not present

## 2023-08-29 DIAGNOSIS — Z6830 Body mass index (BMI) 30.0-30.9, adult: Secondary | ICD-10-CM | POA: Diagnosis not present

## 2023-08-29 DIAGNOSIS — M1A0710 Idiopathic chronic gout, right ankle and foot, without tophus (tophi): Secondary | ICD-10-CM | POA: Diagnosis not present

## 2023-08-29 DIAGNOSIS — E039 Hypothyroidism, unspecified: Secondary | ICD-10-CM | POA: Diagnosis not present

## 2023-08-29 DIAGNOSIS — E781 Pure hyperglyceridemia: Secondary | ICD-10-CM

## 2023-08-29 NOTE — Assessment & Plan Note (Signed)
Chronic, ongoing, stable TSH last check.  Continue Levothyroxine at 150 MCG daily and adjust as needed.  Labs at physical.  Return in 6 months.

## 2023-08-29 NOTE — Assessment & Plan Note (Signed)
Chronic, ongoing.  Continue current medication regimen and adjust as needed. Lipid panel today. 

## 2023-08-29 NOTE — Progress Notes (Signed)
BP 132/72 (BP Location: Left Arm, Cuff Size: Normal)   Pulse 66   Temp 97.6 F (36.4 C) (Oral)   Ht 5' 11.5" (1.816 m)   Wt 223 lb 9.6 oz (101.4 kg)   SpO2 97%   BMI 30.75 kg/m    Subjective:    Patient ID: George Hawkins, male    DOB: December 16, 1961, 62 y.o.   MRN: [id number]  HPI: George Hawkins is a 62 y.o. male  Chief Complaint  Patient presents with   Gout   Hyperlipidemia   Hypertension   Hypothyroidism   HYPERTENSION / HYPERLIPIDEMIA Continues on Atenolol and Lipitor daily.   Satisfied with current treatment? yes Duration of hypertension: chronic BP monitoring frequency: not checking BP range:  BP medication side effects: no Duration of hyperlipidemia: chronic Cholesterol medication side effects: no Cholesterol supplements: fish oil  Medication compliance: good compliance Aspirin: yes Recent stressors: no Recurrent headaches: no Visual changes: no Palpitations: no Dyspnea: no Chest pain: no Lower extremity edema: no Dizzy/lightheaded: no  The 10-year ASCVD risk score (Arnett DK, et al., 2019) is: 8.4%   Values used to calculate the score:     Age: 65 years     Sex: Male     Is Non-Hispanic African American: No     Diabetic: No     Tobacco smoker: No     Systolic Blood Pressure: 132 mmHg     Is BP treated: Yes     HDL Cholesterol: 52 mg/dL     Total Cholesterol: 159 mg/dL  GOUT Continues on Allopurinol 200 MG daily, no recent gout flares.   Duration:chronic Right 1st metatarsophalangeal pain: yes Left 1st metatarsophalangeal pain: no Right knee pain: no Left knee pain: no Recent dietary change or indiscretion: no Fevers: no Nausea/vomiting: no Aggravating factors: diet changes Alleviating factors: Allopurinol Status:  stable Treatments attempted: Allopurinol   HYPOTHYROIDISM Continues Levothyroxine 150 MCG daily. Thyroid control status:stable Satisfied with current treatment? yes Medication side effects: no Medication compliance:  good compliance Etiology of hypothyroidism:  Recent dose adjustment:no Fatigue: no Cold intolerance: no Heat intolerance: no Weight gain: no Weight loss: no Constipation: no Diarrhea/loose stools: no Palpitations: no Lower extremity edema: no Anxiety/depressed mood: no     08/29/2023    8:17 AM 02/25/2023    8:55 AM 08/27/2022   10:44 AM 02/22/2022    9:24 AM 02/20/2021    9:55 AM  Depression screen PHQ 2/9  Decreased Interest 0 0 0 0 0  Down, Depressed, Hopeless 0 0 0 0 0  PHQ - 2 Score 0 0 0 0 0  Altered sleeping 0 0 0 0 0  Tired, decreased energy 0 0 0 0 0  Change in appetite 0 0 0 0 0  Feeling bad or failure about yourself  0 0 0 0 0  Trouble concentrating 0 0 0 0 0  Moving slowly or fidgety/restless 0 0 0 0 0  Suicidal thoughts 0 0 0 0 0  PHQ-9 Score 0 0 0 0 0  Difficult doing work/chores Not difficult at all  Not difficult at all Not difficult at all        08/29/2023    8:17 AM 02/25/2023    8:55 AM 08/27/2022   10:44 AM 02/22/2022    9:25 AM  GAD 7 : Generalized Anxiety Score  Nervous, Anxious, on Edge 0 0 0 0  Control/stop worrying 0 0 0 0  Worry too much - different things 0 0 0  0  Trouble relaxing 0 0 0 0  Restless 0 0 0 0  Easily annoyed or irritable 0 0 0 0  Afraid - awful might happen 0 0 0 0  Total GAD 7 Score 0 0 0 0  Anxiety Difficulty Not difficult at all Not difficult at all Not difficult at all Not difficult at all       Relevant past medical, surgical, family and social history reviewed and updated as indicated. Interim medical history since our last visit reviewed. Allergies and medications reviewed and updated.  Review of Systems  Constitutional:  Negative for activity change, diaphoresis, fatigue and fever.  Respiratory:  Negative for cough, chest tightness, shortness of breath and wheezing.   Cardiovascular:  Negative for chest pain, palpitations and leg swelling.  Gastrointestinal: Negative.   Endocrine: Negative for cold intolerance and heat  intolerance.  Neurological: Negative.   Psychiatric/Behavioral: Negative.      Per HPI unless specifically indicated above     Objective:    BP 132/72 (BP Location: Left Arm, Cuff Size: Normal)   Pulse 66   Temp 97.6 F (36.4 C) (Oral)   Ht 5' 11.5" (1.816 m)   Wt 223 lb 9.6 oz (101.4 kg)   SpO2 97%   BMI 30.75 kg/m   Wt Readings from Last 3 Encounters:  08/29/23 223 lb 9.6 oz (101.4 kg)  02/25/23 219 lb 6.4 oz (99.5 kg)  08/27/22 222 lb 11.2 oz (101 kg)    Physical Exam Vitals and nursing note reviewed.  Constitutional:      General: He is awake. He is not in acute distress.    Appearance: He is well-developed and well-groomed. He is obese. He is not ill-appearing.  HENT:     Head: Normocephalic and atraumatic.     Right Ear: Hearing normal. No drainage.     Left Ear: Hearing normal. No drainage.  Eyes:     General: Lids are normal.        Right eye: No discharge.        Left eye: No discharge.     Conjunctiva/sclera: Conjunctivae normal.     Pupils: Pupils are equal, round, and reactive to light.  Neck:     Thyroid: No thyromegaly.     Vascular: No carotid bruit.     Trachea: Trachea normal.  Cardiovascular:     Rate and Rhythm: Normal rate and regular rhythm.     Heart sounds: Normal heart sounds, S1 normal and S2 normal. No murmur heard.    No gallop.  Pulmonary:     Effort: Pulmonary effort is normal. No accessory muscle usage or respiratory distress.     Breath sounds: Normal breath sounds.  Abdominal:     General: Bowel sounds are normal.     Palpations: Abdomen is soft.  Musculoskeletal:        General: Normal range of motion.     Cervical back: Normal range of motion and neck supple.     Right lower leg: No edema.     Left lower leg: No edema.  Lymphadenopathy:     Cervical: No cervical adenopathy.  Skin:    General: Skin is warm and dry.     Capillary Refill: Capillary refill takes less than 2 seconds.  Neurological:     Mental Status: He is  alert and oriented to person, place, and time.  Psychiatric:        Attention and Perception: Attention normal.  Mood and Affect: Mood normal.        Speech: Speech normal.        Behavior: Behavior normal. Behavior is cooperative.        Thought Content: Thought content normal.    Results for orders placed or performed in visit on 04/09/23  TSH  Result Value Ref Range   TSH 3.620 0.450 - 4.500 uIU/mL  T4, free  Result Value Ref Range   Free T4 1.18 0.82 - 1.77 ng/dL      Assessment & Plan:   Problem List Items Addressed This Visit       Cardiovascular and Mediastinum   Hypertension - Primary    Chronic, stable.  BP at goal in office today.  Continue current medication regimen and adjust as needed.  Refills sent in.  Recommend obtaining BP cuff and checking BP three mornings a week at home + focus on DASH diet.  LABS: CMP. Urine ALB 02 March 2023.  Return in 6 months.      Relevant Orders   Comprehensive metabolic panel     Endocrine   Hypothyroidism    Chronic, ongoing, stable TSH last check.  Continue Levothyroxine at 150 MCG daily and adjust as needed.  Labs at physical.  Return in 6 months.        Other   Chronic gout    Chronic, stable with Allopurinol on board, no further flares.  Will send in refills on Indocin to use as needed if flare presents.  Check uric acid level at physical.      Hyperlipidemia    Chronic, ongoing.  Continue current medication regimen and adjust as needed.  Lipid panel today.        Relevant Orders   Comprehensive metabolic panel   Lipid Panel w/o Chol/HDL Ratio   Obesity    BMI 30.75.  Recommended eating smaller high protein, low fat meals more frequently and exercising 30 mins a day 5 times a week with a goal of 10-15lb weight loss in the next 3 months. Patient voiced their understanding and motivation to adhere to these recommendations.         Follow up plan: Return in about 6 months (around 02/26/2024) for Annual  physical after 02/25/24.

## 2023-08-29 NOTE — Assessment & Plan Note (Signed)
BMI 30.75.  Recommended eating smaller high protein, low fat meals more frequently and exercising 30 mins a day 5 times a week with a goal of 10-15lb weight loss in the next 3 months. Patient voiced their understanding and motivation to adhere to these recommendations.  

## 2023-08-29 NOTE — Assessment & Plan Note (Signed)
Chronic, stable with Allopurinol on board, no further flares.  Will send in refills on Indocin to use as needed if flare presents.  Check uric acid level at physical.

## 2023-08-29 NOTE — Assessment & Plan Note (Signed)
Chronic, stable.  BP at goal in office today.  Continue current medication regimen and adjust as needed.  Refills sent in.  Recommend obtaining BP cuff and checking BP three mornings a week at home + focus on DASH diet.  LABS: CMP. Urine ALB 02 March 2023.  Return in 6 months.

## 2023-08-30 LAB — COMPREHENSIVE METABOLIC PANEL
ALT: 36 IU/L (ref 0–44)
AST: 24 IU/L (ref 0–40)
Albumin: 4.3 g/dL (ref 3.8–4.9)
Alkaline Phosphatase: 108 IU/L (ref 44–121)
BUN/Creatinine Ratio: 18 (ref 10–24)
BUN: 17 mg/dL (ref 8–27)
Bilirubin Total: 0.5 mg/dL (ref 0.0–1.2)
CO2: 23 mmol/L (ref 20–29)
Calcium: 9.2 mg/dL (ref 8.6–10.2)
Chloride: 104 mmol/L (ref 96–106)
Creatinine, Ser: 0.95 mg/dL (ref 0.76–1.27)
Globulin, Total: 2.1 g/dL (ref 1.5–4.5)
Glucose: 107 mg/dL — ABNORMAL HIGH (ref 70–99)
Potassium: 4.3 mmol/L (ref 3.5–5.2)
Sodium: 139 mmol/L (ref 134–144)
Total Protein: 6.4 g/dL (ref 6.0–8.5)
eGFR: 92 mL/min/{1.73_m2} (ref >59)

## 2023-08-30 LAB — LIPID PANEL W/O CHOL/HDL RATIO
Cholesterol, Total: 143 mg/dL (ref 100–199)
HDL: 40 mg/dL (ref >39)
LDL Chol Calc (NIH): 61 mg/dL (ref 0–99)
Triglycerides: 266 mg/dL — ABNORMAL HIGH (ref 0–149)
VLDL Cholesterol Cal: 42 mg/dL — ABNORMAL HIGH (ref 5–40)

## 2023-08-30 NOTE — Progress Notes (Signed)
Contacted via MyChart   Good morning George Hawkins, your labs have returned: - Kidney function, creatinine and eGFR, remains normal, as is liver function, AST and ALT.  - Cholesterol levels stable with exception of some elevation in triglycerides.  Continues Atorvastatin daily and consider taking an Omega 3 supplement daily to help lower triglycerides + focus on healthy diet choices.  Any questions? Keep being amazing!!  Thank you for allowing me to participate in your care.  I appreciate you. Kindest regards, Gearldine Looney

## 2023-12-22 ENCOUNTER — Other Ambulatory Visit (HOSPITAL_COMMUNITY): Payer: Self-pay

## 2024-02-16 ENCOUNTER — Other Ambulatory Visit (HOSPITAL_COMMUNITY): Payer: Self-pay

## 2024-02-28 NOTE — Patient Instructions (Signed)
 Be Involved in Caring For Your Health:  Taking Medications When medications are taken as directed, they can greatly improve your health. But if they are not taken as prescribed, they may not work. In some cases, not taking them correctly can be harmful. To help ensure your treatment remains effective and safe, understand your medications and how to take them. Bring your medications to each visit for review by your provider.  Your lab results, notes, and after visit summary will be available on My Chart. We strongly encourage you to use this feature. If lab results are abnormal the clinic will contact you with the appropriate steps. If the clinic does not contact you assume the results are satisfactory. You can always view your results on My Chart. If you have questions regarding your health or results, please contact the clinic during office hours. You can also ask questions on My Chart.  We at [facility] are grateful that you chose [place] to provide your care. We strive to provide evidence-based and compassionate care and are always looking for feedback. If you get a survey from the clinic please complete this so we can hear your opinions.  Healthy Eating, Adult Healthy eating may help you get and keep a healthy body weight, reduce the risk of chronic disease, and live a long and productive life. It is important to follow a healthy eating pattern. Your nutritional and calorie needs should be met mainly by different nutrient-rich foods. What are tips for following this plan? Reading food labels Read labels and choose the following: Reduced or low sodium products. Juices with 100% fruit juice. Foods with low saturated fats (<3 g per serving) and high polyunsaturated and monounsaturated fats. Foods with whole grains, such as whole wheat, cracked wheat, brown rice, and wild rice. Whole grains that are fortified with folic acid. This is recommended for females who are pregnant or who want  to become pregnant. Read labels and do not eat or drink the following: Foods or drinks with added sugars. These include foods that contain brown sugar, corn sweetener, corn syrup, dextrose, fructose, glucose, high-fructose corn syrup, honey, invert sugar, lactose, malt syrup, maltose, molasses, raw sugar, sucrose, trehalose, or turbinado sugar. Limit your intake of added sugars to less than 10% of your total daily calories. Do not eat more than the following amounts of added sugar per day: 6 teaspoons (25 g) for females. 9 teaspoons (38 g) for males. Foods that contain processed or refined starches and grains. Refined grain products, such as white flour, degermed cornmeal, white bread, and white rice. Shopping Choose nutrient-rich snacks, such as vegetables, whole fruits, and nuts. Avoid high-calorie and high-sugar snacks, such as potato chips, fruit snacks, and candy. Use oil-based dressings and spreads on foods instead of solid fats such as butter, margarine, sour cream, or cream cheese. Limit pre-made sauces, mixes, and "instant" products such as flavored rice, instant noodles, and ready-made pasta. Try more plant-protein sources, such as tofu, tempeh, black beans, edamame, lentils, nuts, and seeds. Explore eating plans such as the Mediterranean diet or vegetarian diet. Try heart-healthy dips made with beans and healthy fats like hummus and guacamole. Vegetables go great with these. Cooking Use oil to saut or stir-fry foods instead of solid fats such as butter, margarine, or lard. Try baking, boiling, grilling, or broiling instead of frying. Remove the fatty part of meats before cooking. Steam vegetables in water or broth. Meal planning  At meals, imagine dividing your plate into fourths: One-half of  your plate is fruits and vegetables. One-fourth of your plate is whole grains. One-fourth of your plate is protein, especially lean meats, poultry, eggs, tofu, beans, or nuts. Include  low-fat dairy as part of your daily diet. Lifestyle Choose healthy options in all settings, including home, work, school, restaurants, or stores. Prepare your food safely: Wash your hands after handling raw meats. Where you prepare food, keep surfaces clean by regularly washing with hot, soapy water. Keep raw meats separate from ready-to-eat foods, such as fruits and vegetables. Cook seafood, meat, poultry, and eggs to the recommended temperature. Get a food thermometer. Store foods at safe temperatures. In general: Keep cold foods at 76F (4.4C) or below. Keep hot foods at 176F (60C) or above. Keep your freezer at [facility] (-17.8C) or below. Foods are not safe to eat if they have been between the temperatures of 40-176F (4.4-60C) for more than 2 hours. What foods should I eat? Fruits Aim to eat 1-2 cups of fresh, canned (in natural juice), or frozen fruits each day. One cup of fruit equals 1 small apple, 1 large banana, 8 large strawberries, 1 cup (237 g) canned fruit,  cup (82 g) dried fruit, or 1 cup (240 mL) 100% juice. Vegetables Aim to eat 2-4 cups of fresh and frozen vegetables each day, including different varieties and colors. One cup of vegetables equals 1 cup (91 g) broccoli or cauliflower florets, 2 medium carrots, 2 cups (150 g) raw, leafy greens, 1 large tomato, 1 large bell pepper, 1 large sweet potato, or 1 medium white potato. Grains Aim to eat 5-10 ounce-equivalents of whole grains each day. Examples of 1 ounce-equivalent of grains include 1 slice of bread, 1 cup (40 g) ready-to-eat cereal, 3 cups (24 g) popcorn, or  cup (93 g) cooked rice. Meats and other proteins Try to eat 5-7 ounce-equivalents of protein each day. Examples of 1 ounce-equivalent of protein include 1 egg,  oz nuts (12 almonds, 24 pistachios, or 7 walnut halves), 1/4 cup (90 g) cooked beans, 6 tablespoons (90 g) hummus or 1 tablespoon (16 g) peanut butter. A cut of meat or fish that is the size of a deck  of cards is about 3-4 ounce-equivalents (85 g). Of the protein you eat each week, try to have at least 8 sounce (227 g) of seafood. This is about 2 servings per week. This includes salmon, trout, herring, sardines, and anchovies. Dairy Aim to eat 3 cup-equivalents of fat-free or low-fat dairy each day. Examples of 1 cup-equivalent of dairy include 1 cup (240 mL) milk, 8 ounces (250 g) yogurt, 1 ounces (44 g) natural cheese, or 1 cup (240 mL) fortified soy milk. Fats and oils Aim for about 5 teaspoons (21 g) of fats and oils per day. Choose monounsaturated fats, such as canola and olive oils, mayonnaise made with olive oil or avocado oil, avocados, peanut butter, and most nuts, or polyunsaturated fats, such as sunflower, corn, and soybean oils, walnuts, pine nuts, sesame seeds, sunflower seeds, and flaxseed. Beverages Aim for 6 eight-ounce glasses of water per day. Limit coffee to 3-5 eight-ounce cups per day. Limit caffeinated beverages that have added calories, such as soda and energy drinks. If you drink alcohol: Limit how much you have to: 0-1 drink a day if you are male. 0-2 drinks a day if you are male. Know how much alcohol is in your drink. In the [place] one drink is one 12 oz bottle of beer (355 mL), one 5 oz glass of wine (  148 mL), or one 1 oz glass of hard liquor (44 mL). Seasoning and other foods Try not to add too much salt to your food. Try using herbs and spices instead of salt. Try not to add sugar to food. This information is based on [place] nutrition guidelines. To learn more, visit [employer]. Exact amounts may vary. You may need different amounts. This information is not intended to replace advice given to you by your health care provider. Make sure you discuss any questions you have with your health care provider. Document Revised: 09/09/2022 Document Reviewed: 09/09/2022 Elsevier Patient Education  2024 [employer].

## 2024-03-01 ENCOUNTER — Ambulatory Visit (INDEPENDENT_AMBULATORY_CARE_PROVIDER_SITE_OTHER): Payer: Commercial Managed Care - PPO | Admitting: Nurse Practitioner

## 2024-03-01 ENCOUNTER — Other Ambulatory Visit (HOSPITAL_COMMUNITY): Payer: Self-pay

## 2024-03-01 ENCOUNTER — Encounter: Payer: Self-pay | Admitting: Nurse Practitioner

## 2024-03-01 VITALS — BP 138/80 | HR 69 | Temp 97.7°F | Ht 71.8 in | Wt 229.2 lb

## 2024-03-01 DIAGNOSIS — M1A0710 Idiopathic chronic gout, right ankle and foot, without tophus (tophi): Secondary | ICD-10-CM | POA: Diagnosis not present

## 2024-03-01 DIAGNOSIS — E6609 Other obesity due to excess calories: Secondary | ICD-10-CM

## 2024-03-01 DIAGNOSIS — N400 Enlarged prostate without lower urinary tract symptoms: Secondary | ICD-10-CM | POA: Diagnosis not present

## 2024-03-01 DIAGNOSIS — E781 Pure hyperglyceridemia: Secondary | ICD-10-CM | POA: Diagnosis not present

## 2024-03-01 DIAGNOSIS — E66811 Obesity, class 1: Secondary | ICD-10-CM

## 2024-03-01 DIAGNOSIS — Z6830 Body mass index (BMI) 30.0-30.9, adult: Secondary | ICD-10-CM

## 2024-03-01 DIAGNOSIS — N522 Drug-induced erectile dysfunction: Secondary | ICD-10-CM | POA: Diagnosis not present

## 2024-03-01 DIAGNOSIS — I10 Essential (primary) hypertension: Secondary | ICD-10-CM | POA: Diagnosis not present

## 2024-03-01 DIAGNOSIS — Z0000 Encounter for general adult medical examination without abnormal findings: Secondary | ICD-10-CM | POA: Diagnosis not present

## 2024-03-01 DIAGNOSIS — E039 Hypothyroidism, unspecified: Secondary | ICD-10-CM | POA: Diagnosis not present

## 2024-03-01 MED ORDER — ATENOLOL 100 MG PO TABS
100.0000 mg | ORAL_TABLET | Freq: Every day | ORAL | 4 refills | Status: AC
  Filled 2024-03-01 (×2): qty 90, 90d supply, fill #0
  Filled 2024-06-02: qty 90, 90d supply, fill #1
  Filled 2024-08-30: qty 90, 90d supply, fill #2
  Filled 2024-12-05: qty 90, 90d supply, fill #3

## 2024-03-01 MED ORDER — LEVOTHYROXINE SODIUM 150 MCG PO TABS
150.0000 ug | ORAL_TABLET | Freq: Every day | ORAL | 4 refills | Status: DC
  Filled 2024-03-01: qty 90, 90d supply, fill #0

## 2024-03-01 MED ORDER — ATORVASTATIN CALCIUM 80 MG PO TABS
80.0000 mg | ORAL_TABLET | Freq: Every day | ORAL | 4 refills | Status: AC
  Filled 2024-03-01: qty 90, 90d supply, fill #0
  Filled 2024-05-24: qty 90, 90d supply, fill #1
  Filled 2024-08-30: qty 90, 90d supply, fill #2
  Filled 2024-12-05: qty 90, 90d supply, fill #3

## 2024-03-01 MED ORDER — TADALAFIL 10 MG PO TABS
10.0000 mg | ORAL_TABLET | Freq: Every day | ORAL | 5 refills | Status: AC | PRN
  Filled 2024-03-01: qty 10, 10d supply, fill #0
  Filled 2024-11-15: qty 10, 50d supply, fill #0

## 2024-03-01 MED ORDER — ALLOPURINOL 100 MG PO TABS
200.0000 mg | ORAL_TABLET | Freq: Every day | ORAL | 4 refills | Status: AC
  Filled 2024-03-01: qty 180, 90d supply, fill #0
  Filled 2024-06-17 (×2): qty 180, 90d supply, fill #1
  Filled 2024-09-21: qty 180, 90d supply, fill #2
  Filled 2024-12-20: qty 180, 90d supply, fill #0

## 2024-03-01 NOTE — Assessment & Plan Note (Signed)
Stable with addition of Cialis.  Will send in refills and adjust regimen as needed. 

## 2024-03-01 NOTE — Progress Notes (Signed)
 BP 138/80 (BP Location: Left Arm, Patient Position: Sitting)   Pulse 69   Temp 97.7 F (36.5 C) (Oral)   Ht 5' 11.8" (1.824 m)   Wt 229 lb 3.2 oz (104 kg)   SpO2 98%   BMI 31.26 kg/m    Subjective:    Patient ID: George Hawkins, male    DOB: 1961-11-02, 63 y.o.   MRN: [id number]  HPI: George Hawkins is a 63 y.o. male presenting on 03/01/2024 for comprehensive medical examination. Current medical complaints include:none  He currently lives with: wife Interim Problems from his last visit: no   HYPERTENSION / HYPERLIPIDEMIA Takes Atenolol 100 MG daily and Lipitor 80 MG daily.  Has a history of gout, last flare 03/14/22 -- taking Allopurinol 200 MG daily.  No recent flares. Satisfied with current treatment? yes Duration of hypertension: chronic BP monitoring frequency: not checking BP range:  BP medication side effects: no Duration of hyperlipidemia: chronic Cholesterol medication side effects: no Cholesterol supplements: fish oil  Medication compliance: good compliance Aspirin: yes Recent stressors: no Recurrent headaches: no Visual changes: no Palpitations: no Dyspnea: no Chest pain: no Lower extremity edema: no Dizzy/lightheaded: no  The 10-year ASCVD risk score (Arnett DK, et al., 2019) is: 9.9%   Values used to calculate the score:     Age: 67 years     Sex: Male     Is Non-Hispanic African American: No     Diabetic: No     Tobacco smoker: No     Systolic Blood Pressure: 138 mmHg     Is BP treated: Yes     HDL Cholesterol: 40 mg/dL     Total Cholesterol: 143 mg/dL   HYPOTHYROIDISM Taking Levothyroxine 150 MCG daily. Thyroid control status:stable Satisfied with current treatment? yes Medication side effects: no Medication compliance: good compliance Etiology of hypothyroidism:  Recent dose adjustment:no Fatigue: no Cold intolerance: no Heat intolerance: no Weight gain: no Weight loss: no Constipation: no Diarrhea/loose stools:  no Palpitations: no Lower extremity edema: no Anxiety/depressed mood: no   ERECTILE DYSFUNCTION Uses Cialis.  Can achieve erection, but has difficulty maintaining.  Taking Cialis without ADR.  Denies any family history of MI or CVA.  Functional Status Survey: Is the patient deaf or have difficulty hearing?: No Does the patient have difficulty seeing, even when wearing glasses/contacts?: No Does the patient have difficulty concentrating, remembering, or making decisions?: No Does the patient have difficulty walking or climbing stairs?: No Does the patient have difficulty dressing or bathing?: No Does the patient have difficulty doing errands alone such as visiting a doctor's office or shopping?: No  FALL RISK:    03/01/2024    8:49 AM 08/29/2023    8:17 AM 02/25/2023    8:54 AM 08/27/2022   10:44 AM 02/22/2022    9:22 AM  Fall Risk   Falls in the past year? 0 0 0 0 0  Number falls in past yr: 0 0 0 0 0  Injury with Fall? 0 0 0 0 0  Risk for fall due to : No Fall Risks No Fall Risks No Fall Risks No Fall Risks No Fall Risks  Follow up Falls evaluation completed Falls evaluation completed Falls evaluation completed Falls evaluation completed Falls evaluation completed    Depression Screen    03/01/2024    8:49 AM 08/29/2023    8:17 AM 02/25/2023    8:55 AM 08/27/2022   10:44 AM 02/22/2022    9:24 AM  Depression  screen PHQ 2/9  Decreased Interest 0 0 0 0 0  Down, Depressed, Hopeless 0 0 0 0 0  PHQ - 2 Score 0 0 0 0 0  Altered sleeping 0 0 0 0 0  Tired, decreased energy 0 0 0 0 0  Change in appetite 0 0 0 0 0  Feeling bad or failure about yourself  0 0 0 0 0  Trouble concentrating 0 0 0 0 0  Moving slowly or fidgety/restless 0 0 0 0 0  Suicidal thoughts 0 0 0 0 0  PHQ-9 Score 0 0 0 0 0  Difficult doing work/chores Not difficult at all Not difficult at all  Not difficult at all Not difficult at all      03/01/2024    8:50 AM 08/29/2023    8:17 AM 02/25/2023    8:55 AM 08/27/2022   10:44  AM  GAD 7 : Generalized Anxiety Score  Nervous, Anxious, on Edge 0 0 0 0  Control/stop worrying 0 0 0 0  Worry too much - different things 0 0 0 0  Trouble relaxing 0 0 0 0  Restless 0 0 0 0  Easily annoyed or irritable 0 0 0 0  Afraid - awful might happen 0 0 0 0  Total GAD 7 Score 0 0 0 0  Anxiety Difficulty Not difficult at all Not difficult at all Not difficult at all Not difficult at all   Past Medical History:  Past Medical History:  Diagnosis Date   Gout    Hyperlipidemia    Hypertension    Hypothyroidism     Surgical History:  Past Surgical History:  Procedure Laterality Date   TENDON REPAIR Right    Hand   VASECTOMY      Medications:  Current Outpatient Medications on File Prior to Visit  Medication Sig   aspirin 81 MG chewable tablet Chew 81 mg by mouth daily.   Fish Oil OIL by Does not apply route.   indomethacin (INDOCIN) 50 MG capsule Take 1 capsule (50 mg total) by mouth 3 (three) times daily as needed   No current facility-administered medications on file prior to visit.    Allergies:  No Known Allergies  Social History:  Social History   Socioeconomic History   Marital status: Married    Spouse name: Not on file   Number of children: Not on file   Years of education: Not on file   Highest education level: Not on file  Occupational History   Not on file  Tobacco Use   Smoking status: Never   Smokeless tobacco: Never  Vaping Use   Vaping status: Never Used  Substance and Sexual Activity   Alcohol use: Yes    Alcohol/week: 14.0 standard drinks of alcohol    Types: 14 Cans of beer per week   Drug use: No   Sexual activity: Yes  Other Topics Concern   Not on file  Social History Narrative   Not on file   Social Drivers of Health   Financial Resource Strain: Low Risk  (03/01/2024)   Overall Financial Resource Strain (CARDIA)    Difficulty of Paying Living Expenses: Not hard at all  Food Insecurity: No Food Insecurity (03/01/2024)    Hunger Vital Sign    Worried About Running Out of Food in the Last Year: Never true    Ran Out of Food in the Last Year: Never true  Transportation Needs: No Transportation Needs (03/01/2024)   PRAPARE -  Administrator, Civil Service (Medical): No    Lack of Transportation (Non-Medical): No  Physical Activity: Sufficiently Active (03/01/2024)   Exercise Vital Sign    Days of Exercise per Week: 5 days    Minutes of Exercise per Session: 60 min  Stress: No Stress Concern Present (03/01/2024)   [employer] of Occupational Health - Occupational Stress Questionnaire    Feeling of Stress : Not at all  Social Connections: Moderately Isolated (03/01/2024)   Social Connection and Isolation Panel [NHANES]    Frequency of Communication with Friends and Family: Three times a week    Frequency of Social Gatherings with Friends and Family: Three times a week    Attends Religious Services: Never    Active Member of Clubs or Organizations: No    Attends Banker Meetings: Never    Marital Status: Married  Catering manager Violence: Not At Risk (03/01/2024)   Humiliation, Afraid, Rape, and Kick questionnaire    Fear of Current or Ex-Partner: No    Emotionally Abused: No    Physically Abused: No    Sexually Abused: No   Social History   Tobacco Use  Smoking Status Never  Smokeless Tobacco Never   Social History   Substance and Sexual Activity  Alcohol Use Yes   Alcohol/week: 14.0 standard drinks of alcohol   Types: 14 Cans of beer per week    Family History:  Family History  Problem Relation Age of Onset   Thyroid disease Brother    Thyroid disease Sister    Thyroid disease Brother    Heart attack Neg Hx    Hyperlipidemia Neg Hx    Hypertension Neg Hx    Sudden death Neg Hx     Past medical history, surgical history, medications, allergies, family history and social history reviewed with patient today and changes made to appropriate areas of the chart.    Review of Systems - Negative All other ROS negative except what is listed above and in the HPI.      Objective:    BP 138/80 (BP Location: Left Arm, Patient Position: Sitting)   Pulse 69   Temp 97.7 F (36.5 C) (Oral)   Ht 5' 11.8" (1.824 m)   Wt 229 lb 3.2 oz (104 kg)   SpO2 98%   BMI 31.26 kg/m   Wt Readings from Last 3 Encounters:  03/01/24 229 lb 3.2 oz (104 kg)  08/29/23 223 lb 9.6 oz (101.4 kg)  02/25/23 219 lb 6.4 oz (99.5 kg)    Physical Exam Vitals and nursing note reviewed.  Constitutional:      General: He is awake.     Appearance: He is well-developed.  HENT:     Head: Normocephalic and atraumatic.     Right Ear: Hearing, tympanic membrane, ear canal and external ear normal. No decreased hearing noted. No drainage.     Left Ear: Hearing, tympanic membrane, ear canal and external ear normal. No decreased hearing noted. No drainage.     Nose: Nose normal.     Right Sinus: No maxillary sinus tenderness or frontal sinus tenderness.     Left Sinus: No maxillary sinus tenderness or frontal sinus tenderness.     Mouth/Throat:     Pharynx: Uvula midline.  Eyes:     General: Lids are normal.        Right eye: No discharge.        Left eye: No discharge.     Conjunctiva/sclera:  Conjunctivae normal.     Pupils: Pupils are equal, round, and reactive to light.     Visual Fields: Right eye visual fields normal and left eye visual fields normal.  Neck:     Thyroid: No thyromegaly.     Vascular: No carotid bruit or JVD.     Trachea: Trachea normal.  Cardiovascular:     Rate and Rhythm: Normal rate and regular rhythm.     Heart sounds: Normal heart sounds, S1 normal and S2 normal. No murmur heard.    No gallop.  Pulmonary:     Effort: Pulmonary effort is normal.     Breath sounds: Normal breath sounds.  Abdominal:     General: Bowel sounds are normal.     Palpations: Abdomen is soft. There is no hepatomegaly or splenomegaly.  Genitourinary:    Penis: Normal.       Rectum: Normal.  Musculoskeletal:        General: Normal range of motion.     Cervical back: Normal range of motion and neck supple.     Right lower leg: No edema.     Left lower leg: No edema.  Lymphadenopathy:     Cervical: No cervical adenopathy.  Skin:    General: Skin is warm and dry.     Capillary Refill: Capillary refill takes less than 2 seconds.     Findings: No rash.  Neurological:     Mental Status: He is alert and oriented to person, place, and time.     Sensory: Sensation is intact.     Motor: Motor function is intact.     Coordination: Coordination is intact.     Gait: Gait is intact.     Deep Tendon Reflexes: Reflexes are normal and symmetric.     Reflex Scores:      Brachioradialis reflexes are 2+ on the right side and 2+ on the left side.      Patellar reflexes are 2+ on the right side and 2+ on the left side. Psychiatric:        Attention and Perception: Attention normal.        Mood and Affect: Mood normal.        Speech: Speech normal.        Behavior: Behavior normal. Behavior is cooperative.        Thought Content: Thought content normal.        Cognition and Memory: Cognition normal.        Judgment: Judgment normal.    Results for orders placed or performed in visit on 08/29/23  Comprehensive metabolic panel   Collection Time: 08/29/23  8:33 AM  Result Value Ref Range   Glucose 107 (H) 70 - 99 mg/dL   BUN 17 8 - 27 mg/dL   Creatinine, Ser 1.61 0.76 - 1.27 mg/dL   eGFR 92 >09 UE/AVW/0.98   BUN/Creatinine Ratio 18 10 - 24   Sodium 139 134 - 144 mmol/L   Potassium 4.3 3.5 - 5.2 mmol/L   Chloride 104 96 - 106 mmol/L   CO2 23 20 - 29 mmol/L   Calcium 9.2 8.6 - 10.2 mg/dL   Total Protein 6.4 6.0 - 8.5 g/dL   Albumin 4.3 3.8 - 4.9 g/dL   Globulin, Total 2.1 1.5 - 4.5 g/dL   Bilirubin Total 0.5 0.0 - 1.2 mg/dL   Alkaline Phosphatase 108 44 - 121 IU/L   AST 24 0 - 40 IU/L   ALT 36 0 - 44 IU/L  Lipid Panel w/o Chol/HDL Ratio   Collection Time:  08/29/23  8:33 AM  Result Value Ref Range   Cholesterol, Total 143 100 - 199 mg/dL   Triglycerides 045 (H) 0 - 149 mg/dL   HDL 40 >40 mg/dL   VLDL Cholesterol Cal 42 (H) 5 - 40 mg/dL   LDL Chol Calc (NIH) 61 0 - 99 mg/dL      Assessment & Plan:   Problem List Items Addressed This Visit       Cardiovascular and Mediastinum   Hypertension - Primary   Chronic, stable.  BP at goal in office today on recheck.  Continue current medication regimen and adjust as needed.  Refills sent in.  Recommend obtaining BP cuff and checking BP three mornings a week at home + focus on DASH diet.  LABS: CBC, CMP, TSH, urine ALB. Urine ALB 02 March 2023.  Return in 6 months.      Relevant Medications   atenolol (TENORMIN) 100 MG tablet   atorvastatin (LIPITOR) 80 MG tablet   tadalafil (CIALIS) 10 MG tablet   Other Relevant Orders   CBC with Differential/Platelet   Comprehensive metabolic panel     Endocrine   Hypothyroidism   Chronic, ongoing.  Continue Levothyroxine at 150 MCG daily and adjust as needed.  Labs today.  Return in 6 months.      Relevant Medications   atenolol (TENORMIN) 100 MG tablet   levothyroxine (SYNTHROID) 150 MCG tablet   Other Relevant Orders   TSH   T4, free     Other   Chronic gout   Chronic, stable with Allopurinol on board, no further flares.  Will send in refills on Indocin as needed to use if flare presents.  Check uric acid level.      Relevant Orders   Uric acid   Erectile dysfunction   Stable with addition of Cialis.  Will send in refills and adjust regimen as needed.      Hyperlipidemia   Chronic, ongoing.  Continue current medication regimen and adjust as needed.  Lipid panel today.        Relevant Medications   atenolol (TENORMIN) 100 MG tablet   atorvastatin (LIPITOR) 80 MG tablet   tadalafil (CIALIS) 10 MG tablet   Other Relevant Orders   Comprehensive metabolic panel   Lipid Panel w/o Chol/HDL Ratio   Obesity   BMI 98.11.  Recommended  eating smaller high protein, low fat meals more frequently and exercising 30 mins a day 5 times a week with a goal of 10-15lb weight loss in the next 3 months. Patient voiced their understanding and motivation to adhere to these recommendations.       Other Visit Diagnoses       Benign prostatic hyperplasia without lower urinary tract symptoms       PSA on labs today.   Relevant Orders   PSA     Encounter for annual physical exam       Annual physical today with labs and health maintenance reviewed, discussed with patient.       Discussed aspirin prophylaxis for myocardial infarction prevention and decision was made to continue ASA  LABORATORY TESTING:  Health maintenance labs ordered today as discussed above.   The natural history of prostate cancer and ongoing controversy regarding screening and potential treatment outcomes of prostate cancer has been discussed with the patient. The meaning of a false positive PSA and a false negative PSA has been discussed. He indicates understanding  of the limitations of this screening test and wishes to proceed with screening PSA testing.   IMMUNIZATIONS:   - Tdap: Tetanus vaccination status reviewed: Up To Date - Influenza: Refused - Pneumovax: Not applicable - Prevnar: Not applicable - Zostavax vaccine: Refused  - Covid: 2 vaccines in past  SCREENING: - Colonoscopy: Up to date -- wants to talk to wife about this first Discussed with patient purpose of the colonoscopy is to detect colon cancer at curable precancerous or early stages   - AAA Screening: Not applicable  -Hearing Test: Not applicable  -Spirometry: Not applicable   PATIENT COUNSELING:    Sexuality: Discussed sexually transmitted diseases, partner selection, use of condoms, avoidance of unintended pregnancy  and contraceptive alternatives.   Advised to avoid cigarette smoking.  I discussed with the patient that most people either abstain from alcohol or drink within safe  limits (<=14/week and <=4 drinks/occasion for males, <=7/weeks and <= 3 drinks/occasion for females) and that the risk for alcohol disorders and other health effects rises proportionally with the number of drinks per week and how often a drinker exceeds daily limits.  Discussed cessation/primary prevention of drug use and availability of treatment for abuse.   Diet: Encouraged to adjust caloric intake to maintain  or achieve ideal body weight, to reduce intake of dietary saturated fat and total fat, to limit sodium intake by avoiding high sodium foods and not adding table salt, and to maintain adequate dietary potassium and calcium preferably from fresh fruits, vegetables, and low-fat dairy products.    Stressed the importance of regular exercise  Injury prevention: Discussed safety belts, safety helmets, smoke detector, smoking near bedding or upholstery.   Dental health: Discussed importance of regular tooth brushing, flossing, and dental visits.   Follow up plan: NEXT PREVENTATIVE PHYSICAL DUE IN 1 YEAR. Return in about 6 months (around 09/01/2024) for HTN/HLD, Hypothyroid.

## 2024-03-01 NOTE — Assessment & Plan Note (Addendum)
 Chronic, stable.  BP at goal in office today on recheck.  Continue current medication regimen and adjust as needed.  Refills sent in.  Recommend obtaining BP cuff and checking BP three mornings a week at home + focus on DASH diet.  LABS: CBC, CMP, TSH, urine ALB. Urine ALB 02 March 2023.  Return in 6 months.

## 2024-03-01 NOTE — Assessment & Plan Note (Signed)
BMI 31.26.  Recommended eating smaller high protein, low fat meals more frequently and exercising 30 mins a day 5 times a week with a goal of 10-15lb weight loss in the next 3 months. Patient voiced their understanding and motivation to adhere to these recommendations.

## 2024-03-01 NOTE — Assessment & Plan Note (Signed)
 Chronic, ongoing.  Continue Levothyroxine at 150 MCG daily and adjust as needed.  Labs today.  Return in 6 months.

## 2024-03-01 NOTE — Assessment & Plan Note (Signed)
 Chronic, ongoing.  Continue current medication regimen and adjust as needed. Lipid panel today.

## 2024-03-01 NOTE — Assessment & Plan Note (Signed)
 Chronic, stable with Allopurinol on board, no further flares.  Will send in refills on Indocin as needed to use if flare presents.  Check uric acid level.

## 2024-03-02 ENCOUNTER — Encounter: Payer: Self-pay | Admitting: Nurse Practitioner

## 2024-03-02 ENCOUNTER — Other Ambulatory Visit (HOSPITAL_COMMUNITY): Payer: Self-pay

## 2024-03-02 ENCOUNTER — Other Ambulatory Visit: Payer: Self-pay | Admitting: Nurse Practitioner

## 2024-03-02 DIAGNOSIS — E039 Hypothyroidism, unspecified: Secondary | ICD-10-CM

## 2024-03-02 LAB — CBC WITH DIFFERENTIAL/PLATELET
Basophils Absolute: 0.1 10*3/uL (ref 0.0–0.2)
Basos: 2 %
EOS (ABSOLUTE): 0.6 10*3/uL — ABNORMAL HIGH (ref 0.0–0.4)
Eos: 9 %
Hematocrit: 43.6 % (ref 37.5–51.0)
Hemoglobin: 14.5 g/dL (ref 13.0–17.7)
Immature Grans (Abs): 0 10*3/uL (ref 0.0–0.1)
Immature Granulocytes: 1 %
Lymphocytes Absolute: 0.9 10*3/uL (ref 0.7–3.1)
Lymphs: 14 %
MCH: 32.1 pg (ref 26.6–33.0)
MCHC: 33.3 g/dL (ref 31.5–35.7)
MCV: 97 fL (ref 79–97)
Monocytes Absolute: 0.5 10*3/uL (ref 0.1–0.9)
Monocytes: 8 %
Neutrophils Absolute: 4.1 10*3/uL (ref 1.4–7.0)
Neutrophils: 66 %
Platelets: 155 10*3/uL (ref 150–450)
RBC: 4.52 x10E6/uL (ref 4.14–5.80)
RDW: 12.1 % (ref 11.6–15.4)
WBC: 6.2 10*3/uL (ref 3.4–10.8)

## 2024-03-02 LAB — COMPREHENSIVE METABOLIC PANEL
ALT: 29 IU/L (ref 0–44)
AST: 23 IU/L (ref 0–40)
Albumin: 4.3 g/dL (ref 3.8–4.9)
Alkaline Phosphatase: 107 IU/L (ref 44–121)
BUN/Creatinine Ratio: 16 (ref 10–24)
BUN: 18 mg/dL (ref 8–27)
Bilirubin Total: 0.8 mg/dL (ref 0.0–1.2)
CO2: 21 mmol/L (ref 20–29)
Calcium: 9.7 mg/dL (ref 8.6–10.2)
Chloride: 99 mmol/L (ref 96–106)
Creatinine, Ser: 1.16 mg/dL (ref 0.76–1.27)
Globulin, Total: 2.3 g/dL (ref 1.5–4.5)
Glucose: 118 mg/dL — ABNORMAL HIGH (ref 70–99)
Potassium: 4.7 mmol/L (ref 3.5–5.2)
Sodium: 137 mmol/L (ref 134–144)
Total Protein: 6.6 g/dL (ref 6.0–8.5)
eGFR: 72 mL/min/{1.73_m2} (ref >59)

## 2024-03-02 LAB — T4, FREE: Free T4: 1.28 ng/dL (ref 0.82–1.77)

## 2024-03-02 LAB — LIPID PANEL W/O CHOL/HDL RATIO
Cholesterol, Total: 156 mg/dL (ref 100–199)
HDL: 54 mg/dL (ref >39)
LDL Chol Calc (NIH): 61 mg/dL (ref 0–99)
Triglycerides: 262 mg/dL — ABNORMAL HIGH (ref 0–149)
VLDL Cholesterol Cal: 41 mg/dL — ABNORMAL HIGH (ref 5–40)

## 2024-03-02 LAB — TSH: TSH: 4.91 u[IU]/mL — ABNORMAL HIGH (ref 0.450–4.500)

## 2024-03-02 LAB — PSA: Prostate Specific Ag, Serum: 0.2 ng/mL (ref 0.0–4.0)

## 2024-03-02 LAB — URIC ACID: Uric Acid: 5.8 mg/dL (ref 3.8–8.4)

## 2024-03-02 NOTE — Progress Notes (Signed)
 Contacted via MyChart -- needs lab only visit in 4 weeks please   Good morning Molli Hazard, your labs have returned: - CBC shows no anemia or infection. - Kidney function, creatinine and eGFR, remains normal, as is liver function, AST and ALT.  - Lipid panel shows stable LDL level, but triglycerides remain a bit elevated.  Continue all current medications and focus heavily on diet. - Uric acid is at goal level, continue Allopurinol. - PSA, prostate level, is normal. - Thyroid labs: TSH and Free T4, shows mild elevation in TSH.  I would like to maintain current Levothyroxine dose, but please ensure you are consistently taking this every morning 30 minutes before eating and other medications.  We will recheck this outpatient in 4 weeks and if TSH remains elevated I may adjust dose then.  Any questions? Keep being amazing!!  Thank you for allowing me to participate in your care.  I appreciate you. Kindest regards, Alaney Witter

## 2024-03-03 NOTE — Progress Notes (Signed)
 Lab appt scheduled.

## 2024-03-22 DIAGNOSIS — H524 Presbyopia: Secondary | ICD-10-CM | POA: Diagnosis not present

## 2024-03-31 ENCOUNTER — Other Ambulatory Visit

## 2024-04-15 ENCOUNTER — Other Ambulatory Visit

## 2024-04-15 DIAGNOSIS — E039 Hypothyroidism, unspecified: Secondary | ICD-10-CM | POA: Diagnosis not present

## 2024-04-16 ENCOUNTER — Other Ambulatory Visit: Payer: Self-pay | Admitting: Nurse Practitioner

## 2024-04-16 ENCOUNTER — Other Ambulatory Visit (HOSPITAL_COMMUNITY): Payer: Self-pay

## 2024-04-16 ENCOUNTER — Encounter: Payer: Self-pay | Admitting: Nurse Practitioner

## 2024-04-16 DIAGNOSIS — E039 Hypothyroidism, unspecified: Secondary | ICD-10-CM

## 2024-04-16 LAB — TSH: TSH: 4.92 u[IU]/mL — ABNORMAL HIGH (ref 0.450–4.500)

## 2024-04-16 LAB — T4, FREE: Free T4: 1.18 ng/dL (ref 0.82–1.77)

## 2024-04-16 MED ORDER — LEVOTHYROXINE SODIUM 175 MCG PO TABS
175.0000 ug | ORAL_TABLET | Freq: Every day | ORAL | 1 refills | Status: DC
  Filled 2024-04-16: qty 60, 60d supply, fill #0
  Filled 2024-06-17 (×2): qty 60, 60d supply, fill #1

## 2024-04-16 NOTE — Progress Notes (Signed)
 Contacted via MyChart -- needs lab visit in 6 weeks outpatient please   Good morning George Hawkins, your labs have returned and TSH remains mildly elevated.  I would like to adjust Levothyroxine  dose a little, we will stop the 150 MCG dosing and start 175 MCG dosing that I have sent in.  Then we will recheck labs outpatient in 6 weeks.  Any questions? Keep being amazing!!  Thank you for allowing me to participate in your care.  I appreciate you. Kindest regards, Mossie Gilder

## 2024-04-19 ENCOUNTER — Other Ambulatory Visit (HOSPITAL_COMMUNITY): Payer: Self-pay

## 2024-04-19 NOTE — Progress Notes (Signed)
 Lab appt scheduled.

## 2024-05-07 ENCOUNTER — Other Ambulatory Visit (HOSPITAL_COMMUNITY): Payer: Self-pay

## 2024-05-07 ENCOUNTER — Other Ambulatory Visit: Payer: Self-pay | Admitting: Nurse Practitioner

## 2024-05-07 DIAGNOSIS — M1A0710 Idiopathic chronic gout, right ankle and foot, without tophus (tophi): Secondary | ICD-10-CM

## 2024-05-10 ENCOUNTER — Other Ambulatory Visit (HOSPITAL_COMMUNITY): Payer: Self-pay

## 2024-05-10 MED ORDER — INDOMETHACIN 50 MG PO CAPS
50.0000 mg | ORAL_CAPSULE | Freq: Three times a day (TID) | ORAL | 1 refills | Status: AC
  Filled 2024-05-10: qty 270, 90d supply, fill #0

## 2024-05-10 NOTE — Telephone Encounter (Signed)
 Requested Prescriptions  Pending Prescriptions Disp Refills   indomethacin  (INDOCIN ) 50 MG capsule 270 capsule 0    Sig: Take 1 capsule (50 mg total) by mouth 3 (three) times daily as needed     Analgesics:  NSAIDS Failed - 05/10/2024  2:54 PM      Failed - Manual Review: Labs are only required if the patient has taken medication for more than 8 weeks.      Passed - Cr in normal range and within 360 days    Creatinine, Ser  Date Value Ref Range Status  03/01/2024 1.16 0.76 - 1.27 mg/dL Final         Passed - HGB in normal range and within 360 days    Hemoglobin  Date Value Ref Range Status  03/01/2024 14.5 13.0 - 17.7 g/dL Final         Passed - PLT in normal range and within 360 days    Platelets  Date Value Ref Range Status  03/01/2024 155 150 - 450 x10E3/uL Final         Passed - HCT in normal range and within 360 days    Hematocrit  Date Value Ref Range Status  03/01/2024 43.6 37.5 - 51.0 % Final         Passed - eGFR is 30 or above and within 360 days    GFR calc Af Amer  Date Value Ref Range Status  08/23/2020 93 >59 mL/min/1.73 Final    Comment:    **[facility] currently reports eGFR in compliance with the current**   recommendations of the [employer]. [facility] will   update reporting as new guidelines are published from the NKF-ASN   Task force.    GFR calc non Af Amer  Date Value Ref Range Status  08/23/2020 80 >59 mL/min/1.73 Final   eGFR  Date Value Ref Range Status  03/01/2024 72 >59 mL/min/1.73 Final         Passed - Patient is not pregnant      Passed - Valid encounter within last 12 months    Recent Outpatient Visits           2 months ago Primary hypertension   [facility] [place], Lavelle Posey, NP

## 2024-05-24 ENCOUNTER — Other Ambulatory Visit (HOSPITAL_COMMUNITY): Payer: Self-pay

## 2024-05-31 ENCOUNTER — Other Ambulatory Visit

## 2024-05-31 DIAGNOSIS — E039 Hypothyroidism, unspecified: Secondary | ICD-10-CM

## 2024-06-01 ENCOUNTER — Ambulatory Visit: Payer: Self-pay | Admitting: Nurse Practitioner

## 2024-06-01 LAB — T4, FREE: Free T4: 0.98 ng/dL (ref 0.82–1.77)

## 2024-06-01 LAB — TSH: TSH: 2.8 u[IU]/mL (ref 0.450–4.500)

## 2024-06-01 NOTE — Progress Notes (Signed)
 Contacted via MyChart   Good afternoon George Hawkins, your thyroid  labs have returned and are normal this check.  Continue current medication dosing.  Any questions?

## 2024-06-02 ENCOUNTER — Other Ambulatory Visit (HOSPITAL_COMMUNITY): Payer: Self-pay

## 2024-06-04 ENCOUNTER — Other Ambulatory Visit (HOSPITAL_COMMUNITY): Payer: Self-pay

## 2024-06-17 ENCOUNTER — Other Ambulatory Visit (HOSPITAL_COMMUNITY): Payer: Self-pay

## 2024-08-19 ENCOUNTER — Other Ambulatory Visit: Payer: Self-pay | Admitting: Nurse Practitioner

## 2024-08-20 ENCOUNTER — Other Ambulatory Visit (HOSPITAL_COMMUNITY): Payer: Self-pay

## 2024-08-20 MED ORDER — LEVOTHYROXINE SODIUM 175 MCG PO TABS
175.0000 ug | ORAL_TABLET | Freq: Every day | ORAL | 1 refills | Status: DC
  Filled 2024-08-20: qty 90, 90d supply, fill #0
  Filled 2024-11-15: qty 90, 90d supply, fill #1
  Filled 2024-11-15: qty 60, 60d supply, fill #1

## 2024-08-20 NOTE — Telephone Encounter (Signed)
 Requested Prescriptions  Pending Prescriptions Disp Refills   levothyroxine  (SYNTHROID ) 175 MCG tablet 90 tablet 1    Sig: Take 1 tablet (175 mcg total) by mouth daily.     Endocrinology:  Hypothyroid Agents Passed - 08/20/2024  2:40 PM      Passed - TSH in normal range and within 360 days    TSH  Date Value Ref Range Status  05/31/2024 2.800 0.450 - 4.500 uIU/mL Final         Passed - Valid encounter within last 12 months    Recent Outpatient Visits           5 months ago Primary hypertension   [place] [facility] [place], Melanie DASEN, NP

## 2024-09-06 ENCOUNTER — Ambulatory Visit: Admitting: Nurse Practitioner

## 2024-09-24 ENCOUNTER — Encounter: Payer: Self-pay | Admitting: Nurse Practitioner

## 2024-09-24 ENCOUNTER — Ambulatory Visit: Admitting: Nurse Practitioner

## 2024-09-24 VITALS — BP 128/74 | HR 64 | Temp 98.4°F | Resp 15 | Ht 71.81 in | Wt 227.0 lb

## 2024-09-24 DIAGNOSIS — E039 Hypothyroidism, unspecified: Secondary | ICD-10-CM | POA: Diagnosis not present

## 2024-09-24 DIAGNOSIS — E781 Pure hyperglyceridemia: Secondary | ICD-10-CM

## 2024-09-24 DIAGNOSIS — E6609 Other obesity due to excess calories: Secondary | ICD-10-CM

## 2024-09-24 DIAGNOSIS — I10 Essential (primary) hypertension: Secondary | ICD-10-CM | POA: Diagnosis not present

## 2024-09-24 DIAGNOSIS — Z6830 Body mass index (BMI) 30.0-30.9, adult: Secondary | ICD-10-CM

## 2024-09-24 DIAGNOSIS — E66811 Obesity, class 1: Secondary | ICD-10-CM | POA: Diagnosis not present

## 2024-09-24 NOTE — Assessment & Plan Note (Signed)
 Chronic, ongoing.  Continue Levothyroxine  at 150 MCG daily and adjust as needed.  Labs at physical.  Return in 6 months.

## 2024-09-24 NOTE — Assessment & Plan Note (Signed)
BMI 30.95.  Recommended eating smaller high protein, low fat meals more frequently and exercising 30 mins a day 5 times a week with a goal of 10-15lb weight loss in the next 3 months. Patient voiced their understanding and motivation to adhere to these recommendations.

## 2024-09-24 NOTE — Assessment & Plan Note (Signed)
 Chronic, stable.  BP at goal in office today on recheck, white coat present.  Continue current medication regimen and adjust as needed.  Refills sent in.  Recommend obtaining BP cuff and checking BP three mornings a week at home + focus on DASH diet.  LABS: BMP.  Urine ALB 02 March 2023, recheck at physical.  Return in 6 months.

## 2024-09-24 NOTE — Assessment & Plan Note (Signed)
 Chronic, ongoing.  Continue current medication regimen and adjust as needed. Lipid panel today.

## 2024-09-24 NOTE — Progress Notes (Signed)
 BP 128/74 (BP Location: Left Arm, Patient Position: Sitting, Cuff Size: Normal)   Pulse 64   Temp 98.4 F (36.9 C) (Oral)   Resp 15   Ht 5' 11.81 (1.824 m)   Wt 227 lb (103 kg)   SpO2 99%   BMI 30.95 kg/m    Subjective:    Patient ID: George Hawkins, male    DOB: 12-05-1961, 63 y.o.   MRN: [id number]  HPI: George Hawkins is a 63 y.o. male  Chief Complaint  Patient presents with   HTN/HLD    No home checks, no concerns.    Hypothyroidism   HYPERTENSION / HYPERLIPIDEMIA Taking Atenolol  and Lipitor daily.   Satisfied with current treatment? yes Duration of hypertension: chronic BP monitoring frequency: not checking BP range:  BP medication side effects: no Duration of hyperlipidemia: chronic Cholesterol medication side effects: no Cholesterol supplements: fish oil  Medication compliance: good compliance Aspirin: yes Recent stressors: no Recurrent headaches: no Visual changes: no Palpitations: no Dyspnea: no Chest pain: no Lower extremity edema: no Dizzy/lightheaded: no  The 10-year ASCVD risk score (Arnett DK, et al., 2019) is: 8.3%   Values used to calculate the score:     Age: 39 years     Clincally relevant sex: Male     Is Non-Hispanic African American: No     Diabetic: No     Tobacco smoker: No     Systolic Blood Pressure: 128 mmHg     Is BP treated: Yes     HDL Cholesterol: 54 mg/dL     Total Cholesterol: 156 mg/dL  HYPOTHYROIDISM Taking Levothyroxine  175 MCG daily. Thyroid  control status:stable Satisfied with current treatment? yes Medication side effects: no Medication compliance: good compliance Etiology of hypothyroidism:  Recent dose adjustment:no Fatigue: no Cold intolerance: no Heat intolerance: no Weight gain: no Weight loss: no Constipation: no Diarrhea/loose stools: no Palpitations: no Lower extremity edema: no Anxiety/depressed mood: no     03/01/2024    8:49 AM 08/29/2023    8:17 AM 02/25/2023    8:55 AM 08/27/2022    10:44 AM 02/22/2022    9:24 AM  Depression screen PHQ 2/9  Decreased Interest 0 0 0 0 0  Down, Depressed, Hopeless 0 0 0 0 0  PHQ - 2 Score 0 0 0 0 0  Altered sleeping 0 0 0 0 0  Tired, decreased energy 0 0 0 0 0  Change in appetite 0 0 0 0 0  Feeling bad or failure about yourself  0 0 0 0 0  Trouble concentrating 0 0 0 0 0  Moving slowly or fidgety/restless 0 0 0 0 0  Suicidal thoughts 0 0 0 0 0  PHQ-9 Score 0 0 0 0 0  Difficult doing work/chores Not difficult at all Not difficult at all  Not difficult at all Not difficult at all       03/01/2024    8:50 AM 08/29/2023    8:17 AM 02/25/2023    8:55 AM 08/27/2022   10:44 AM  GAD 7 : Generalized Anxiety Score  Nervous, Anxious, on Edge 0 0 0 0  Control/stop worrying 0 0 0 0  Worry too much - different things 0 0 0 0  Trouble relaxing 0 0 0 0  Restless 0 0 0 0  Easily annoyed or irritable 0 0 0 0  Afraid - awful might happen 0 0 0 0  Total GAD 7 Score 0 0 0 0  Anxiety Difficulty  Not difficult at all Not difficult at all Not difficult at all Not difficult at all   Relevant past medical, surgical, family and social history reviewed and updated as indicated. Interim medical history since our last visit reviewed. Allergies and medications reviewed and updated.  Review of Systems  Constitutional:  Negative for activity change, diaphoresis, fatigue and fever.  Respiratory:  Negative for cough, chest tightness, shortness of breath and wheezing.   Cardiovascular:  Negative for chest pain, palpitations and leg swelling.  Gastrointestinal: Negative.   Endocrine: Negative for cold intolerance and heat intolerance.  Neurological: Negative.   Psychiatric/Behavioral: Negative.      Per HPI unless specifically indicated above     Objective:    BP 128/74 (BP Location: Left Arm, Patient Position: Sitting, Cuff Size: Normal)   Pulse 64   Temp 98.4 F (36.9 C) (Oral)   Resp 15   Ht 5' 11.81 (1.824 m)   Wt 227 lb (103 kg)   SpO2 99%   BMI  30.95 kg/m   Wt Readings from Last 3 Encounters:  09/24/24 227 lb (103 kg)  03/01/24 229 lb 3.2 oz (104 kg)  08/29/23 223 lb 9.6 oz (101.4 kg)    Physical Exam Vitals and nursing note reviewed.  Constitutional:      General: He is awake. He is not in acute distress.    Appearance: He is well-developed and well-groomed. He is obese. He is not ill-appearing.  HENT:     Head: Normocephalic and atraumatic.     Right Ear: Hearing normal. No drainage.     Left Ear: Hearing normal. No drainage.  Eyes:     General: Lids are normal.        Right eye: No discharge.        Left eye: No discharge.     Conjunctiva/sclera: Conjunctivae normal.     Pupils: Pupils are equal, round, and reactive to light.  Neck:     Thyroid : No thyromegaly.     Vascular: No carotid bruit.     Trachea: Trachea normal.  Cardiovascular:     Rate and Rhythm: Normal rate and regular rhythm.     Heart sounds: Normal heart sounds, S1 normal and S2 normal. No murmur heard.    No gallop.  Pulmonary:     Effort: Pulmonary effort is normal. No accessory muscle usage or respiratory distress.     Breath sounds: Normal breath sounds.  Abdominal:     General: Bowel sounds are normal.     Palpations: Abdomen is soft.  Musculoskeletal:        General: Normal range of motion.     Cervical back: Normal range of motion and neck supple.     Right lower leg: No edema.     Left lower leg: No edema.  Lymphadenopathy:     Cervical: No cervical adenopathy.  Skin:    General: Skin is warm and dry.     Capillary Refill: Capillary refill takes less than 2 seconds.  Neurological:     Mental Status: He is alert and oriented to person, place, and time.  Psychiatric:        Attention and Perception: Attention normal.        Mood and Affect: Mood normal.        Speech: Speech normal.        Behavior: Behavior normal. Behavior is cooperative.        Thought Content: Thought content normal.    Results for  orders placed or  performed in visit on 05/31/24  TSH   Collection Time: 05/31/24  8:59 AM  Result Value Ref Range   TSH 2.800 0.450 - 4.500 uIU/mL  T4, free   Collection Time: 05/31/24  8:59 AM  Result Value Ref Range   Free T4 0.98 0.82 - 1.77 ng/dL      Assessment & Plan:   Problem List Items Addressed This Visit       Cardiovascular and Mediastinum   Hypertension - Primary   Chronic, stable.  BP at goal in office today on recheck, white coat present.  Continue current medication regimen and adjust as needed.  Refills sent in.  Recommend obtaining BP cuff and checking BP three mornings a week at home + focus on DASH diet.  LABS: BMP.  Urine ALB 02 March 2023, recheck at physical.  Return in 6 months.      Relevant Orders   Basic metabolic panel with GFR   Lipid Panel w/o Chol/HDL Ratio     Endocrine   Hypothyroidism   Chronic, ongoing.  Continue Levothyroxine  at 150 MCG daily and adjust as needed.  Labs at physical.  Return in 6 months.        Other   Obesity   BMI 30.95.  Recommended eating smaller high protein, low fat meals more frequently and exercising 30 mins a day 5 times a week with a goal of 10-15lb weight loss in the next 3 months. Patient voiced their understanding and motivation to adhere to these recommendations.       Hyperlipidemia   Chronic, ongoing.  Continue current medication regimen and adjust as needed.  Lipid panel today.        Relevant Orders   Lipid Panel w/o Chol/HDL Ratio     Follow up plan: Return in about 6 months (around 03/25/2025) for Annual Physical  -- after 03/01/25.

## 2024-09-24 NOTE — Patient Instructions (Signed)
 Be Involved in Caring For Your Health:  Taking Medications When medications are taken as directed, they can greatly improve your health. But if they are not taken as prescribed, they may not work. In some cases, not taking them correctly can be harmful. To help ensure your treatment remains effective and safe, understand your medications and how to take them. Bring your medications to each visit for review by your provider.  Your lab results, notes, and after visit summary will be available on My Chart. We strongly encourage you to use this feature. If lab results are abnormal the clinic will contact you with the appropriate steps. If the clinic does not contact you assume the results are satisfactory. You can always view your results on My Chart. If you have questions regarding your health or results, please contact the clinic during office hours. You can also ask questions on My Chart.  We at [facility] are grateful that you chose us  to provide your care. We strive to provide evidence-based and compassionate care and are always looking for feedback. If you get a survey from the clinic please complete this so we can hear your opinions.  DASH Eating Plan DASH stands for Dietary Approaches to Stop Hypertension. The DASH eating plan is a healthy eating plan that has been shown to: Lower high blood pressure (hypertension). Reduce your risk for type 2 diabetes, heart disease, and stroke. Help with weight loss. What are tips for following this plan? Reading food labels Check food labels for the amount of salt (sodium) per serving. Choose foods with less than 5 percent of the Daily Value (DV) of sodium. In general, foods with less than 300 milligrams (mg) of sodium per serving fit into this eating plan. To find whole grains, look for the word whole as the first word in the ingredient list. Shopping Buy products labeled as low-sodium or no salt added. Buy fresh foods. Avoid canned  foods and pre-made or frozen meals. Cooking Try not to add salt when you cook. Use salt-free seasonings or herbs instead of table salt or sea salt. Check with your health care provider or pharmacist before using salt substitutes. Do not fry foods. Cook foods in healthy ways, such as baking, boiling, grilling, roasting, or broiling. Cook using oils that are good for your heart. These include olive, canola, avocado, soybean, and sunflower oil. Meal planning  Eat a balanced diet. This should include: 4 or more servings of fruits and 4 or more servings of vegetables each day. Try to fill half of your plate with fruits and vegetables. 6-8 servings of whole grains each day. 6 or less servings of lean meat, poultry, or fish each day. 1 oz is 1 serving. A 3 oz (85 g) serving of meat is about the same size as the palm of your hand. One egg is 1 oz (28 g). 2-3 servings of low-fat dairy each day. One serving is 1 cup (237 mL). 1 serving of nuts, seeds, or beans 5 times each week. 2-3 servings of heart-healthy fats. Healthy fats called omega-3 fatty acids are found in foods such as walnuts, flaxseeds, fortified milks, and eggs. These fats are also found in cold-water fish, such as sardines, salmon, and mackerel. Limit how much you eat of: Canned or prepackaged foods. Food that is high in trans fat, such as fried foods. Food that is high in saturated fat, such as fatty meat. Desserts and other sweets, sugary drinks, and other foods with added sugar. Full-fat  dairy products. Do not salt foods before eating. Do not eat more than 4 egg yolks a week. Try to eat at least 2 vegetarian meals a week. Eat more home-cooked food and less restaurant, buffet, and fast food. Lifestyle When eating at a restaurant, ask if your food can be made with less salt or no salt. If you drink alcohol: Limit how much you have to: 0-1 drink a day if you are male. 0-2 drinks a day if you are male. Know how much alcohol is in  your drink. In the [place] one drink is one 12 oz bottle of beer (355 mL), one 5 oz glass of wine (148 mL), or one 1 oz glass of hard liquor (44 mL). General information Avoid eating more than 2,300 mg of salt a day. If you have hypertension, you may need to reduce your sodium intake to 1,500 mg a day. Work with your provider to stay at a healthy body weight or lose weight. Ask what the best weight range is for you. On most days of the week, get at least 30 minutes of exercise that causes your heart to beat faster. This may include walking, swimming, or biking. Work with your provider or dietitian to adjust your eating plan to meet your specific calorie needs. What foods should I eat? Fruits All fresh, dried, or frozen fruit. Canned fruits that are in their natural juice and do not have sugar added to them. Vegetables Fresh or frozen vegetables that are raw, steamed, roasted, or grilled. Low-sodium or reduced-sodium tomato and vegetable juice. Low-sodium or reduced-sodium tomato sauce and tomato paste. Low-sodium or reduced-sodium canned vegetables. Grains Whole-grain or whole-wheat bread. Whole-grain or whole-wheat pasta. Brown rice. [place]il Madeira. Bulgur. Whole-grain and low-sodium cereals. Pita bread. Low-fat, low-sodium crackers. Whole-wheat flour tortillas. Meats and other proteins Skinless chicken or [place]. Ground chicken or [place]. Pork with fat trimmed off. Fish and seafood. Egg whites. Dried beans, peas, or lentils. Unsalted nuts, nut butters, and seeds. Unsalted canned beans. Lean cuts of beef with fat trimmed off. Low-sodium, lean precooked or cured meat, such as sausages or meat loaves. Dairy Low-fat (1%) or fat-free (skim) milk. Reduced-fat, low-fat, or fat-free cheeses. Nonfat, low-sodium ricotta or cottage cheese. Low-fat or nonfat yogurt. Low-fat, low-sodium cheese. Fats and oils Soft margarine without trans fats. Vegetable oil. Reduced-fat, low-fat, or light mayonnaise and salad  dressings (reduced-sodium). Canola, safflower, olive, avocado, soybean, and sunflower oils. Avocado. Seasonings and condiments Herbs. Spices. Seasoning mixes without salt. Other foods Unsalted popcorn and pretzels. Fat-free sweets. The items listed above may not be all the foods and drinks you can have. Talk to a dietitian to learn more. What foods should I avoid? Fruits Canned fruit in a light or heavy syrup. Fried fruit. Fruit in cream or butter sauce. Vegetables Creamed or fried vegetables. Vegetables in a cheese sauce. Regular canned vegetables that are not marked as low-sodium or reduced-sodium. Regular canned tomato sauce and paste that are not marked as low-sodium or reduced-sodium. Regular tomato and vegetable juices that are not marked as low-sodium or reduced-sodium. Dene. Olives. Grains Baked goods made with fat, such as croissants, muffins, or some breads. Dry pasta or rice meal packs. Meats and other proteins Fatty cuts of meat. Ribs. Fried meat. [place]. Bologna, salami, and other precooked or cured meats, such as sausages or meat loaves, that are not lean and low in sodium. Fat from the back of a pig (fatback). Bratwurst. Salted nuts and seeds. Canned beans with added salt. Canned  or smoked fish. Whole eggs or egg yolks. Chicken or [place] with skin. Dairy Whole or 2% milk, cream, and half-and-half. Whole or full-fat cream cheese. Whole-fat or sweetened yogurt. Full-fat cheese. Nondairy creamers. Whipped toppings. Processed cheese and cheese spreads. Fats and oils Butter. Stick margarine. Lard. Shortening. Ghee. Bacon fat. Tropical oils, such as coconut, palm kernel, or palm oil. Seasonings and condiments Onion salt, garlic salt, seasoned salt, table salt, and sea salt. Worcestershire sauce. Tartar sauce. Barbecue sauce. Teriyaki sauce. Soy sauce, including reduced-sodium soy sauce. Steak sauce. Canned and packaged gravies. Fish sauce. Oyster sauce. Cocktail sauce. Store-bought  horseradish. Ketchup. Mustard. Meat flavorings and tenderizers. Bouillon cubes. Hot sauces. Pre-made or packaged marinades. Pre-made or packaged taco seasonings. Relishes. Regular salad dressings. Other foods Salted popcorn and pretzels. The items listed above may not be all the foods and drinks you should avoid. Talk to a dietitian to learn more. Where to find more information National Heart, Lung, and Blood Institute (NHLBI): BuffaloDryCleaner.gl American Heart Association (AHA): heart.org Academy of Nutrition and Dietetics: eatright.org National Kidney Foundation (NKF): kidney.org This information is not intended to replace advice given to you by your health care provider. Make sure you discuss any questions you have with your health care provider. Document Revised: 12/26/2022 Document Reviewed: 12/26/2022 Elsevier Patient Education  2024 [employer].

## 2024-09-25 ENCOUNTER — Ambulatory Visit: Payer: Self-pay | Admitting: Nurse Practitioner

## 2024-09-25 LAB — BASIC METABOLIC PANEL WITH GFR
BUN/Creatinine Ratio: 15 (ref 10–24)
BUN: 15 mg/dL (ref 8–27)
CO2: 22 mmol/L (ref 20–29)
Calcium: 9.2 mg/dL (ref 8.6–10.2)
Chloride: 102 mmol/L (ref 96–106)
Creatinine, Ser: 1.01 mg/dL (ref 0.76–1.27)
Glucose: 109 mg/dL — ABNORMAL HIGH (ref 70–99)
Potassium: 4.6 mmol/L (ref 3.5–5.2)
Sodium: 140 mmol/L (ref 134–144)
eGFR: 85 mL/min/1.73 (ref >59)

## 2024-09-25 LAB — LIPID PANEL W/O CHOL/HDL RATIO
Cholesterol, Total: 143 mg/dL (ref 100–199)
HDL: 46 mg/dL (ref >39)
LDL Chol Calc (NIH): 54 mg/dL (ref 0–99)
Triglycerides: 279 mg/dL — ABNORMAL HIGH (ref 0–149)
VLDL Cholesterol Cal: 43 mg/dL — ABNORMAL HIGH (ref 5–40)

## 2024-09-25 NOTE — Progress Notes (Signed)
 Contacted via MyChart  Good morning George Hawkins, your labs have returned. Kidney function, creatinine and eGFR, remains normal. Lipid panel shows LDL at goal, but triglycerides remain elevated.  I would recommend taking Omega 3 supplement at home and focus on healthy diet.  Any questions? Keep being amazing!!  Thank you for allowing me to participate in your care.  I appreciate you. Kindest regards, Denica Web

## 2024-11-15 ENCOUNTER — Other Ambulatory Visit: Payer: Self-pay

## 2024-11-15 ENCOUNTER — Other Ambulatory Visit (HOSPITAL_COMMUNITY): Payer: Self-pay

## 2024-12-20 ENCOUNTER — Other Ambulatory Visit: Payer: Self-pay

## 2024-12-20 ENCOUNTER — Other Ambulatory Visit (HOSPITAL_COMMUNITY): Payer: Self-pay

## 2024-12-21 ENCOUNTER — Other Ambulatory Visit (HOSPITAL_COMMUNITY): Payer: Self-pay

## 2025-01-10 ENCOUNTER — Other Ambulatory Visit: Payer: Self-pay | Admitting: Nurse Practitioner

## 2025-01-11 ENCOUNTER — Other Ambulatory Visit: Payer: Self-pay

## 2025-01-11 ENCOUNTER — Other Ambulatory Visit (HOSPITAL_COMMUNITY): Payer: Self-pay

## 2025-01-11 MED ORDER — LEVOTHYROXINE SODIUM 175 MCG PO TABS
175.0000 ug | ORAL_TABLET | Freq: Every day | ORAL | 1 refills | Status: AC
  Filled 2025-01-11: qty 90, 90d supply, fill #0

## 2025-01-11 NOTE — Telephone Encounter (Signed)
 Requested Prescriptions  Pending Prescriptions Disp Refills   levothyroxine  (SYNTHROID ) 175 MCG tablet 90 tablet 1    Sig: Take 1 tablet (175 mcg total) by mouth daily.     Endocrinology:  Hypothyroid Agents Passed - 01/11/2025  8:25 AM      Passed - TSH in normal range and within 360 days    TSH  Date Value Ref Range Status  05/31/2024 2.800 0.450 - 4.500 uIU/mL Final         Passed - Valid encounter within last 12 months    Recent Outpatient Visits           3 months ago Primary hypertension   [facility] [place], [place] T, NP   10 months ago Primary hypertension    [facility] [place], Melanie DASEN, NP

## 2025-03-30 ENCOUNTER — Encounter: Admitting: Nurse Practitioner
